# Patient Record
Sex: Male | Born: 1965 | Race: Black or African American | Hispanic: No | Marital: Single | Smoking: Current every day smoker
Health system: Southern US, Community
[De-identification: ages and names within clinical notes are randomized; demographics above are authoritative.]

## PROBLEM LIST (undated history)

## (undated) DIAGNOSIS — J45909 Unspecified asthma, uncomplicated: Secondary | ICD-10-CM

---

## 2017-09-09 ENCOUNTER — Encounter: Payer: Self-pay | Admitting: Emergency Medicine

## 2017-09-09 DIAGNOSIS — R109 Unspecified abdominal pain: Secondary | ICD-10-CM | POA: Diagnosis not present

## 2017-09-09 DIAGNOSIS — J45909 Unspecified asthma, uncomplicated: Secondary | ICD-10-CM | POA: Diagnosis not present

## 2017-09-09 DIAGNOSIS — R112 Nausea with vomiting, unspecified: Secondary | ICD-10-CM | POA: Insufficient documentation

## 2017-09-09 DIAGNOSIS — R0602 Shortness of breath: Secondary | ICD-10-CM | POA: Diagnosis not present

## 2017-09-09 DIAGNOSIS — F17200 Nicotine dependence, unspecified, uncomplicated: Secondary | ICD-10-CM | POA: Diagnosis not present

## 2017-09-09 DIAGNOSIS — K8050 Calculus of bile duct without cholangitis or cholecystitis without obstruction: Secondary | ICD-10-CM | POA: Insufficient documentation

## 2017-09-09 LAB — CBC
HEMATOCRIT: 48.7 % (ref 40.0–52.0)
HEMOGLOBIN: 16.2 g/dL (ref 13.0–18.0)
MCH: 29.2 pg (ref 26.0–34.0)
MCHC: 33.2 g/dL (ref 32.0–36.0)
MCV: 88 fL (ref 80.0–100.0)
Platelets: 220 10*3/uL (ref 150–440)
RBC: 5.53 MIL/uL (ref 4.40–5.90)
RDW: 13.2 % (ref 11.5–14.5)
WBC: 6.3 10*3/uL (ref 3.8–10.6)

## 2017-09-09 LAB — COMPREHENSIVE METABOLIC PANEL WITH GFR
ALT: 17 U/L (ref 17–63)
AST: 21 U/L (ref 15–41)
Albumin: 4.2 g/dL (ref 3.5–5.0)
Alkaline Phosphatase: 72 U/L (ref 38–126)
Anion gap: 10 (ref 5–15)
BUN: 9 mg/dL (ref 6–20)
CO2: 26 mmol/L (ref 22–32)
Calcium: 9.1 mg/dL (ref 8.9–10.3)
Chloride: 100 mmol/L — ABNORMAL LOW (ref 101–111)
Creatinine, Ser: 1.1 mg/dL (ref 0.61–1.24)
GFR calc Af Amer: >60 mL/min
GFR calc non Af Amer: >60 mL/min
Glucose, Bld: 113 mg/dL — ABNORMAL HIGH (ref 65–99)
Potassium: 4 mmol/L (ref 3.5–5.1)
Sodium: 136 mmol/L (ref 135–145)
Total Bilirubin: 1.6 mg/dL — ABNORMAL HIGH (ref 0.3–1.2)
Total Protein: 7.6 g/dL (ref 6.5–8.1)

## 2017-09-09 LAB — TROPONIN I: Troponin I: <0.03 ng/mL

## 2017-09-09 LAB — LIPASE, BLOOD: LIPASE: 35 U/L (ref 11–51)

## 2017-09-09 NOTE — ED Triage Notes (Signed)
Pt ambulatory to triage with steady gait, no distress noted. Pt c/o upper center abdominal pain x2 days. Pt denies N/V/D.

## 2017-09-10 ENCOUNTER — Emergency Department: Payer: BLUE CROSS/BLUE SHIELD

## 2017-09-10 ENCOUNTER — Emergency Department
Admission: EM | Admit: 2017-09-10 | Discharge: 2017-09-10 | Disposition: A | Discharge status: Home / Self Care | Payer: BLUE CROSS/BLUE SHIELD | Attending: Emergency Medicine | Admitting: Emergency Medicine

## 2017-09-10 DIAGNOSIS — R0602 Shortness of breath: Secondary | ICD-10-CM

## 2017-09-10 DIAGNOSIS — R112 Nausea with vomiting, unspecified: Secondary | ICD-10-CM

## 2017-09-10 DIAGNOSIS — R109 Unspecified abdominal pain: Secondary | ICD-10-CM

## 2017-09-10 DIAGNOSIS — K8050 Calculus of bile duct without cholangitis or cholecystitis without obstruction: Secondary | ICD-10-CM

## 2017-09-10 HISTORY — DX: Unspecified asthma, uncomplicated: J45.909

## 2017-09-10 LAB — URINALYSIS, COMPLETE (UACMP) WITH MICROSCOPIC
BILIRUBIN URINE: NEGATIVE
Bacteria, UA: NONE SEEN
GLUCOSE, UA: NEGATIVE mg/dL
HGB URINE DIPSTICK: NEGATIVE
Ketones, ur: 5 mg/dL — AB
Leukocytes, UA: NEGATIVE
NITRITE: NEGATIVE
PROTEIN: NEGATIVE mg/dL
Specific Gravity, Urine: 1.011 (ref 1.005–1.030)
pH: 7 (ref 5.0–8.0)

## 2017-09-10 MED ORDER — PREDNISONE 20 MG PO TABS
60.0000 mg | ORAL_TABLET | Freq: Once | ORAL | Status: AC
  Administered 2017-09-10: 60 mg via ORAL
  Filled 2017-09-10: qty 3

## 2017-09-10 MED ORDER — FAMOTIDINE 40 MG PO TABS: 40.0000 mg | ORAL_TABLET | Freq: Every day | ORAL | 0 refills | Status: AC

## 2017-09-10 MED ORDER — METOCLOPRAMIDE HCL 10 MG PO TABS: 10.0000 mg | ORAL_TABLET | Freq: Three times a day (TID) | ORAL | 0 refills | Status: DC | PRN

## 2017-09-10 MED ORDER — IPRATROPIUM-ALBUTEROL 0.5-2.5 (3) MG/3ML IN SOLN
3.0000 mL | Freq: Once | RESPIRATORY_TRACT | Status: AC
  Administered 2017-09-10: 3 mL via RESPIRATORY_TRACT
  Filled 2017-09-10: qty 3

## 2017-09-10 MED ORDER — METOCLOPRAMIDE HCL 10 MG PO TABS
10.0000 mg | ORAL_TABLET | Freq: Once | ORAL | Status: AC
  Administered 2017-09-10: 10 mg via ORAL
  Filled 2017-09-10: qty 1

## 2017-09-10 MED ORDER — GI COCKTAIL ~~LOC~~
30.0000 mL | Freq: Once | ORAL | Status: AC
  Administered 2017-09-10: 30 mL via ORAL
  Filled 2017-09-10: qty 30
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
## 2017-09-10 NOTE — Discharge Instructions (Signed)
These follow-up with the acute care clinic as well as with surgery for further evaluation of your gallbladder sludge and pain.

## 2017-09-10 NOTE — ED Provider Notes (Signed)
[facility] Emergency Department Provider Note   ____________________________________________   First MD Initiated Contact with Patient 09/10/17 [phone]     (approximate)  I have reviewed the triage vital signs and the nursing notes.   HISTORY  Chief Complaint Abdominal Pain    HPI Ahsan Esterline is a 52 y.o. male who comes into the hospital today stating that he has a problem with his stomach and his breathing.  He reports that he has had some stomach pain for the past couple of days and he vomited a couple of times.  It was yellow looking and he states that his pain is currently a 2 out of 10.  He denies any diarrhea.  He is also had shortness of breath for what he reports is a good little while.  He states it is been a few years.  He reports that sometimes when he walks or is talking he gets out of breath.  The patient is a smoker.  He reports that is not any worse than it has been but also his neck in any better.  The patient denies any chest pain.  He is at some cough with white sputum.  The patient does not go to the physician's office.   Past Medical History:  Diagnosis Date  . Asthma     There are no active problems to display for this patient.   History reviewed. No pertinent surgical history.  Prior to Admission medications   Not on File    Allergies Patient has no known allergies.  History reviewed. No pertinent family history.  Social History Social History   Tobacco Use  . Smoking status: Current Every Day Smoker  . Smokeless tobacco: Never Used  Substance Use Topics  . Alcohol use: Not on file  . Drug use: Not on file    Review of Systems  Constitutional: No fever/chills Eyes: No visual changes. ENT: No sore throat. Cardiovascular: Denies chest pain. Respiratory:  shortness of breath. Gastrointestinal:  abdominal pain, nausea, vomiting.  No diarrhea.  No constipation. Genitourinary: Negative for  dysuria. Musculoskeletal: Negative for back pain. Skin: Negative for rash. Neurological: Negative for headaches, focal weakness or numbness.   ____________________________________________   PHYSICAL EXAM:  VITAL SIGNS: ED Triage Vitals [09/09/17 2309]  Enc Vitals Group     BP (!) 149/82     Pulse Rate 62     Resp 16     Temp 98.2 F (36.8 C)     Temp Source Oral     SpO2 100 %     Weight 130 lb (59 kg)     Height 5\' 4"  (1.626 m)     Head Circumference      Peak Flow      Pain Score      Pain Loc      Pain Edu?      Excl. in [place]?     Constitutional: Alert and oriented. Well appearing and in moderate distress. Eyes: Conjunctivae are normal. PERRL. EOMI. Head: Atraumatic. Nose: No congestion/rhinnorhea. Mouth/Throat: Mucous membranes are moist.  Oropharynx non-erythematous. Cardiovascular: Normal rate, regular rhythm. Grossly normal heart sounds.  Good peripheral circulation. Respiratory: Normal respiratory effort.  No retractions. Lungs CTAB. Gastrointestinal: Soft with some epigastric and RUQ pain to palpation. No distention. Positive bowel sounds Musculoskeletal: No lower extremity tenderness nor edema.   Neurologic:  Normal speech and language.  Skin:  Skin is warm, dry and intact.  Psychiatric: Mood and affect are normal.  ____________________________________________   LABS (all labs ordered are listed, but only abnormal results are displayed)  Labs Reviewed  COMPREHENSIVE METABOLIC PANEL - Abnormal; Notable for the following components:      Result Value   Chloride 100 (*)    Glucose, Bld 113 (*)    Total Bilirubin 1.6 (*)    All other components within normal limits  URINALYSIS, COMPLETE (UACMP) WITH MICROSCOPIC - Abnormal; Notable for the following components:   Color, Urine YELLOW (*)    APPearance CLEAR (*)    Ketones, ur 5 (*)    Squamous Epithelial / LPF 0-5 (*)    All other components within normal limits  LIPASE, BLOOD  CBC  TROPONIN I    ____________________________________________  EKG  ED ECG REPORT I, Loney Hering, the attending physician, personally viewed and interpreted this ECG.   Date: 09/09/2017  EKG Time: 2311  Rate: 57  Rhythm: sinus bradycardia  Axis: normal  Intervals:none  ST&T Change: none  ____________________________________________  RADIOLOGY  Dg Chest 2 View  Result Date: 09/10/2017 CLINICAL DATA:  Shortness of breath. EXAM: CHEST  2 VIEW COMPARISON:  None. FINDINGS: The lungs are hyperinflated. The cardiomediastinal contours are normal. Pulmonary vasculature is normal. No consolidation, pleural effusion, or pneumothorax. No acute osseous abnormalities are seen. 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 No acute abnormality. Electronically Signed   By: Jeb Levering M.D.   On: 09/10/2017 03:23   US Abdomen Limited Ruq  Result Date: 09/10/2017 CLINICAL DATA:  Abdominal pain for 2 months. EXAM: ULTRASOUND ABDOMEN LIMITED RIGHT UPPER QUADRANT COMPARISON:  None. FINDINGS: Gallbladder: Physiologically distended with mild intraluminal sludge. No gallstones or wall thickening visualized. No sonographic Murphy sign noted by sonographer. Common bile duct: Diameter: 4 mm, normal. Liver: No focal lesion identified. Within normal limits in parenchymal echogenicity. Portal vein is patent on color Doppler imaging with normal direction of blood flow towards the liver. IMPRESSION: 1. Gallbladder sludge without gallstones or gallbladder inflammation. 2. No biliary dilatation. 3. Normal sonographic appearance of the liver. Electronically Signed   By: Jeb Levering M.D.   On: 09/10/2017 03:45    ____________________________________________   PROCEDURES  Procedure(s) performed: None  Procedures  Critical Care performed: No  ____________________________________________   INITIAL IMPRESSION / ASSESSMENT AND PLAN / ED COURSE  As part of my medical decision  making, I reviewed the following data within the electronic MEDICAL RECORD NUMBER Notes from prior ED visits and [place] Controlled Substance Database   This is a 52 year old male who comes into the hospital today with some upper abdominal pain as well as some shortness of breath.  The pain is acute and started a couple of days ago but the patient's shortness of breath has been chronic.  My differential diagnosis regarding the patient's abdominal pain is gastritis, pancreatitis, biliary colic.  My differential diagnosis regarding the patient's shortness of breath includes pneumonia, acute coronary syndrome, COPD.  The patient had some blood work drawn and it was unremarkable.  The patient did have a mildly elevated bilirubin of 1.6.  The patient's urinalysis was unremarkable his lipase was negative and his troponin was negative.  Since the patient has been having this pain going on for some time I did not feel it appropriate to repeat the troponin.  The patient had a chest x-ray that showed some hyperinflation and an ultrasound that showed some gallbladder sludge without stones or inflammation.  I feel that the patient's pain is due to biliary  colic.  I initially gave the patient a GI cocktail but he vomited after that.  He states that he felt much better.  I then gave him some Reglan and his pain improved.  I will discharge the patient to home to follow-up with surgery for removal of his gallbladder.  I will give him some medicine for gastritis as well as some nausea medicine.  He should return with any other concerns or conditions.  Clinical Course as of Sep 10 506  Tue Sep 10, 2017  0229 Hyperinflation, no acute abnormality DG Chest 2 View [AW]    Clinical Course User Index [AW] Loney Hering, MD     ____________________________________________   FINAL CLINICAL IMPRESSION(S) / ED DIAGNOSES  Final diagnoses:  Abdominal pain  Non-intractable vomiting with nausea, unspecified vomiting type   Biliary colic  Shortness of breath     ED Discharge Orders    None       Note:  This document was prepared using Dragon voice recognition software and may include unintentional dictation errors.    Loney Hering, MD 09/10/17 [phone]

## 2017-09-12 ENCOUNTER — Telehealth: Payer: Self-pay | Admitting: Surgery

## 2017-09-12 NOTE — Telephone Encounter (Signed)
Patient has called and made an appointment with Dr Dahlia Byes.

## 2017-09-12 NOTE — Telephone Encounter (Signed)
I have called patient to make an appointment with any surgeon for biliary colic-gallbladder. Patient was seen in the ED at [facility] for this. No answer. I have left a message for patient to call the office to make an appointment.

## 2017-09-17 ENCOUNTER — Other Ambulatory Visit
Admission: RE | Admit: 2017-09-17 | Discharge: 2017-09-17 | Disposition: A | Discharge status: Home / Self Care | Payer: BLUE CROSS/BLUE SHIELD | Source: Ambulatory Visit | Attending: Surgery | Admitting: Surgery

## 2017-09-17 ENCOUNTER — Telehealth: Payer: Self-pay

## 2017-09-17 ENCOUNTER — Encounter: Payer: Self-pay | Admitting: Surgery

## 2017-09-17 ENCOUNTER — Ambulatory Visit: Payer: BLUE CROSS/BLUE SHIELD | Admitting: Surgery

## 2017-09-17 DIAGNOSIS — R634 Abnormal weight loss: Secondary | ICD-10-CM

## 2017-09-17 LAB — CBC WITH DIFFERENTIAL/PLATELET
Basophils Absolute: 0.1 10*3/uL (ref 0–0.1)
Basophils Relative: 1 %
EOS ABS: 0.2 10*3/uL (ref 0–0.7)
Eosinophils Relative: 3 %
HCT: 47.2 % (ref 40.0–52.0)
HEMOGLOBIN: 16 g/dL (ref 13.0–18.0)
LYMPHS ABS: 3.8 10*3/uL — AB (ref 1.0–3.6)
Lymphocytes Relative: 43 %
MCH: 29.8 pg (ref 26.0–34.0)
MCHC: 33.8 g/dL (ref 32.0–36.0)
MCV: 88 fL (ref 80.0–100.0)
MONO ABS: 0.8 10*3/uL (ref 0.2–1.0)
MONOS PCT: 9 %
NEUTROS PCT: 44 %
Neutro Abs: 3.9 10*3/uL (ref 1.4–6.5)
Platelets: 265 10*3/uL (ref 150–440)
RBC: 5.36 MIL/uL (ref 4.40–5.90)
RDW: 13.4 % (ref 11.5–14.5)
WBC: 8.8 10*3/uL (ref 3.8–10.6)

## 2017-09-17 LAB — BILIRUBIN, FRACTIONATED(TOT/DIR/INDIR)
BILIRUBIN TOTAL: 0.5 mg/dL (ref 0.3–1.2)
Bilirubin, Direct: <0.1 mg/dL — ABNORMAL LOW (ref 0.1–0.5)

## 2017-09-17 NOTE — Patient Instructions (Signed)
Please go downstairs and have your blood drawn today. We will call you with your results.  We have scheduled you for a CT Scan of your Abdomen and Pelvis. This has been scheduled at [facility] location. Please Check-in at 12:45 PM, 15 minutes prior to your scheduled appointment. If you need to reschedule your Scan, you may do so by calling [phone].  You will need to pick up a prep kit at least 24 hours in advance of your Scan: You may pick this up at the [facility] department at [place] Location, or [employer].  Bring a list of medications with you to your appointment and you may have nothing to eat or drink 4 hours prior to your CT Scan.   We will refer you to gastroenterology so they could scope you. They will contact you with the appointment date and time. Please call us in case they have not contacted you in a week.

## 2017-09-17 NOTE — Progress Notes (Signed)
Patient ID: Willie Dalton, male   DOB: [birth year], 52 y.o.   MRN: [id number]  HPI Willie Dalton is a 52 y.o. male seen after being evaluated in the ER by Dr. Dahlia Client for abdominal pain. Reports that he has been having the pain for close to 2 years. He is in the epigastric area apparently gets worse after eating. He does have some burning sensation in his epigastric area. Denies any NSAIDs intake. He also reports about 25 pounds of weight loss over the last year or so.He smokes a pack and half a day. Does have a family history of colon cancer. He reports that he has had nausea and vomited a few times as well as constipation and diarrhea. He does have some dyspnea on exertion. Ultrasound performed a week ago , of personally reviewed it, evidence was large. Normal common bile duct no stones are present. His bilirubin was slightly elevated to 1.5. White count is normal as well as creatinine  HPI  Past Medical History:  Diagnosis Date  . Asthma     No past surgical history on file.  Family History  Problem Relation Age of Onset  . Colon cancer Mother   . Asthma Mother   . Healthy Father   . Colon cancer Maternal Uncle   . Colon cancer Maternal Uncle     Social History Social History   Tobacco Use  . Smoking status: Current Every Day Smoker  . Smokeless tobacco: Never Used  Substance Use Topics  . Alcohol use: Yes  . Drug use: No    Allergies  Allergen Reactions  . Bee Venom Anaphylaxis    Current Outpatient Medications  Medication Sig Dispense Refill  . famotidine (PEPCID) 40 MG tablet Take 1 tablet (40 mg total) by mouth daily for 14 days. 14 tablet 0   No current facility-administered medications for this visit.      Review of Systems Full ROS  was asked and was negative except for the information on the HPI  Physical Exam Blood pressure (!) 142/90, pulse 66, temperature 98.3 F (36.8 C), temperature source Oral, height 5\' 4"  (1.626 m), weight 49.9 kg (110  lb). CONSTITUTIONAL: Very thin pt in NAD  EYES: Pupils are equal, round, and reactive to light, Sclera are non-icteric. EARS, NOSE, MOUTH AND THROAT: The oropharynx is clear. The oral mucosa is pink and moist. Hearing is intact to voice. LYMPH NODES:  Lymph nodes in the neck are normal. RESPIRATORY:  Lungs are clear. There is normal respiratory effort, with equal breath sounds bilaterally, and without pathologic use of accessory muscles. CARDIOVASCULAR: Heart is regular without murmurs, gallops, or rubs. GI: The abdomen is  soft, Mild tenderness to palpation on epigastric area. No murphy or peritonitis. There is no hepatosplenomegaly. There are normal bowel sounds GU: Rectal deferred.   MUSCULOSKELETAL: Normal muscle strength and tone. No cyanosis or edema.   SKIN: Turgor is good and there are no pathologic skin lesions or ulcers. NEUROLOGIC: Motor and sensation is grossly normal. Cranial nerves are grossly intact. PSYCH:  Oriented to person, place and time. Affect is normal.  Data Reviewed  I have personally reviewed the patient's imaging, laboratory findings and medical records.    Assessment/Plan 52 year old male with epigastric pain and weight loss. Clinical history is not consistent with biliary colic. She did have some sludge on ultrasound. Because of the weight loss family history of colon cancer and my differential will be a GI malignancy versus peptic ulcer disease. I will start with  a CT scan of the abdomen and pelvis as well as a GI consultation for upper and lower endoscopy. He has never had a colonoscopy in the past and he does have symptoms related to peptic ulcer disease. And I will perform this workup before entertaining any surgical interventions.  scars with the patient in detail about my thought process and he understands. No need for any emergent surgical intervention at this time I'll repeat LFTs and  Fractionated bilirubin he had a slight increase in bilirubin a week  ago  Caroleen Hamman, MD FACS General Surgeon 09/17/2017, 2:01 PM

## 2017-09-17 NOTE — Telephone Encounter (Signed)
Called patient to let him know that his lab results were normal. I also told him that once he had his CT Scan on 09/30/2017 that I would call him with results.

## 2017-09-20 ENCOUNTER — Ambulatory Visit
Admission: RE | Admit: 2017-09-20 | Discharge: 2017-09-20 | Disposition: A | Discharge status: Home / Self Care | Payer: BLUE CROSS/BLUE SHIELD | Source: Ambulatory Visit | Attending: Surgery | Admitting: Surgery

## 2017-09-20 DIAGNOSIS — Z681 Body mass index (BMI) 19 or less, adult: Secondary | ICD-10-CM | POA: Diagnosis not present

## 2017-09-20 DIAGNOSIS — K5730 Diverticulosis of large intestine without perforation or abscess without bleeding: Secondary | ICD-10-CM | POA: Diagnosis not present

## 2017-09-20 DIAGNOSIS — R634 Abnormal weight loss: Secondary | ICD-10-CM | POA: Diagnosis not present

## 2017-09-20 MED ORDER — IOPAMIDOL (ISOVUE-300) INJECTION 61%
75.0000 mL | Freq: Once | INTRAVENOUS | Status: AC | PRN
  Administered 2017-09-20: 75 mL via INTRAVENOUS

## 2017-09-23 NOTE — Telephone Encounter (Signed)
-----   Message from Jules Husbands, MD sent at 09/20/2017  7:05 PM EST ----- Please let him know that his CT was nml. He needs to keep up w his appts ----- Message ----- From: Interface, Rad Results In Sent: 09/20/2017   3:36 PM To: Jules Husbands, MD

## 2017-09-23 NOTE — Telephone Encounter (Signed)
Called patient per Dr. Dahlia Byes to let him know that his CT Scan was normal and to keep his upcoming appointments.

## 2017-09-25 ENCOUNTER — Other Ambulatory Visit
Admission: RE | Admit: 2017-09-25 | Discharge: 2017-09-25 | Disposition: A | Discharge status: Home / Self Care | Payer: BLUE CROSS/BLUE SHIELD | Source: Ambulatory Visit | Attending: Gastroenterology | Admitting: Gastroenterology

## 2017-09-25 ENCOUNTER — Ambulatory Visit: Payer: BLUE CROSS/BLUE SHIELD | Admitting: Gastroenterology

## 2017-09-25 ENCOUNTER — Encounter: Payer: Self-pay | Admitting: Gastroenterology

## 2017-09-25 ENCOUNTER — Ambulatory Visit (INDEPENDENT_AMBULATORY_CARE_PROVIDER_SITE_OTHER): Payer: BLUE CROSS/BLUE SHIELD | Admitting: Gastroenterology

## 2017-09-25 VITALS — BP 133/80 | HR 71 | Temp 97.9°F | Ht 64.0 in | Wt 113.8 lb

## 2017-09-25 DIAGNOSIS — F17200 Nicotine dependence, unspecified, uncomplicated: Secondary | ICD-10-CM

## 2017-09-25 DIAGNOSIS — G8929 Other chronic pain: Secondary | ICD-10-CM | POA: Insufficient documentation

## 2017-09-25 DIAGNOSIS — F1290 Cannabis use, unspecified, uncomplicated: Secondary | ICD-10-CM

## 2017-09-25 DIAGNOSIS — R1013 Epigastric pain: Secondary | ICD-10-CM

## 2017-09-25 DIAGNOSIS — R683 Clubbing of fingers: Secondary | ICD-10-CM | POA: Diagnosis not present

## 2017-09-25 DIAGNOSIS — R634 Abnormal weight loss: Secondary | ICD-10-CM

## 2017-09-25 LAB — TSH: TSH: 1.289 u[IU]/mL (ref 0.350–4.500)

## 2017-09-25 MED ORDER — PEG 3350-KCL-NA BICARB-NACL 420 G PO SOLR
4000.0000 mL | Freq: Once | ORAL | 0 refills | Status: AC
Start: 2017-09-25 — End: 2017-09-25

## 2017-09-25 MED ORDER — OMEPRAZOLE 40 MG PO CPDR: 40.0000 mg | DELAYED_RELEASE_CAPSULE | Freq: Every day | ORAL | 3 refills | Status: DC

## 2017-09-25 NOTE — Progress Notes (Signed)
Jonathon Bellows MD, MRCP(U.K) [address]  [place]  [place], [place] [address]  Main: [phone]  Fax: [phone]   Gastroenterology Consultation  Referring Provider:     Jules Husbands, MD Primary Care Physician:  Patient, No Pcp Per Primary Gastroenterologist:  Dr. Jonathon Bellows  Reason for Consultation:     Abdominal pain         HPI:   Willie Dalton is a 52 y.o. y/o male referred for evaluation of abdominal pain.   He has been referred for epigastric pain. He was recently seen by Dr Dahlia Byes in Surgery for abnormal weight loss and epigastric pain. He wanted to rule out a GI malignancy or peptic ulcer disease , was referred to [place].  Ct scan of the abdomen 09/20/17: no acute abnormalities 09/10/17: RUQ USG: Gall bladder sludge , no biliary dilation.   Recent labs 09/2017 : normal CBC, Elevated T bilirubin , when repeated was normal   Abdominal pain: Onset: ongoing for a few months , not better or not getting much worse , As long he takes his pepcid feels better , has the pain only when he does not take the meds  Site :points to his epigastrium  Radiation: localized  Severity :10/10 -bent ovr  Nature of pain: punching in nature  Aggravating factors: worse when he walks, not affected with eating  Relieving factors :mediccations  Weight loss: 20 lbs over a few weeks  NSAID use: no  PPI use :Pepcid  Gall bladder surgery: no  Frequency of bowel movements: daily , soft  Change in bowel movements: no  Relief with bowel movements: a bit  Gas/Bloating/Abdominal distension: yes , no association with pain   Mom and uncle had colon cancer, never had a colonoscopy, he has been a smoker for the past 25 years. Uses marijuana. Feels better when he takes a hot shower.     Past Medical History:  Diagnosis Date  . Asthma     No past surgical history on file.  Prior to Admission medications   Medication Sig Start Date End Date Taking? Authorizing Provider  famotidine (PEPCID)  40 MG tablet Take 1 tablet (40 mg total) by mouth daily for 14 days. 09/10/17 09/24/17  Loney Hering, MD    Family History  Problem Relation Age of Onset  . Colon cancer Mother   . Asthma Mother   . Healthy Father   . Colon cancer Maternal Uncle   . Colon cancer Maternal Uncle      Social History   Tobacco Use  . Smoking status: Current Every Day Smoker  . Smokeless tobacco: Never Used  Substance Use Topics  . Alcohol use: Yes  . Drug use: No    Allergies as of 09/25/2017 - Review Complete 09/20/2017  Allergen Reaction Noted  . Bee venom Anaphylaxis 09/17/2017    Review of Systems:    All systems reviewed and negative except where noted in HPI.   Physical Exam:  There were no vitals taken for this visit. No LMP for male patient. Psych:  Alert and cooperative. Normal mood and affect. General:   Alert,  Well-developed, well-nourished, pleasant and cooperative in NAD Head:  Normocephalic and atraumatic. Eyes:  Sclera clear, no icterus.   Conjunctiva pink. Ears:  Normal auditory acuity. Nose:  No deformity, discharge, or lesions. Mouth:  No deformity or lesions,oropharynx pink & moist. Neck:  Supple; no masses or thyromegaly. Lungs:  Respirations even and unlabored.  Clear throughout to auscultation.  No wheezes, crackles, or rhonchi. No acute distress. Heart:  Regular rate and rhythm; no murmurs, clicks, rubs, or gallops. Abdomen:  Normal bowel sounds.  No bruits.  Soft, non-tender and non-distended without masses, hepatosplenomegaly or hernias noted.  No guarding or rebound tenderness.    Neurologic:  Alert and oriented x3;  grossly normal neurologically. Skin:  Intact without significant lesions or rashes. No jaundice. Lymph Nodes:  No significant cervical adenopathy. Psych:  Alert and cooperative. Normal mood and affect.  Imaging Studies: Dg Chest 2 View  Result Date: 09/10/2017 CLINICAL DATA:  Shortness of breath. EXAM: CHEST  2 VIEW COMPARISON:  None.  FINDINGS: The lungs are hyperinflated. The cardiomediastinal contours are normal. Pulmonary vasculature is normal. No consolidation, pleural effusion, or pneumothorax. No acute osseous abnormalities are seen. 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 No acute abnormality. Electronically Signed   By: Jeb Levering M.D.   On: 09/10/2017 03:23   Ct Abdomen Pelvis W Contrast  Result Date: 09/20/2017 CLINICAL DATA:  Nausea vomiting diarrhea 4 months with 20 pound weight loss EXAM: CT ABDOMEN AND PELVIS WITH CONTRAST TECHNIQUE: Multidetector CT imaging of the abdomen and pelvis was performed using the standard protocol following bolus administration of intravenous contrast. CONTRAST:  12mL ISOVUE-300 IOPAMIDOL (ISOVUE-300) INJECTION 61% COMPARISON:  Ultrasound 09/10/2017 FINDINGS: Lower chest: negative Hepatobiliary: Normal liver.  Gallbladder and bile ducts normal. Pancreas: Negative Spleen: Negative Adrenals/Urinary Tract: Normal kidneys. No renal mass or obstruction or calculus. Normal urinary bladder Stomach/Bowel: Negative for bowel obstruction. Negative for bowel mass or edema. Normal appendix. Mild diverticular change in the sigmoid colon without acute edema. Vascular/Lymphatic: Atherosclerotic aorta, mild.  No aneurysm. Reproductive: Mild prostate enlargement Other: Negative for free fluid Musculoskeletal: Mild chronic compression fracture superior endplate of L2. No acute skeletal lesion. IMPRESSION: No acute abnormality. No cause for weight loss identified. Mild sigmoid diverticulosis. Electronically Signed   By: Franchot Gallo M.D.   On: 09/20/2017 15:33   US Abdomen Limited Ruq  Result Date: 09/10/2017 CLINICAL DATA:  Abdominal pain for 2 months. EXAM: ULTRASOUND ABDOMEN LIMITED RIGHT UPPER QUADRANT COMPARISON:  None. FINDINGS: Gallbladder: Physiologically distended with mild intraluminal sludge. No gallstones or wall thickening visualized. No sonographic Murphy  sign noted by sonographer. Common bile duct: Diameter: 4 mm, normal. Liver: No focal lesion identified. Within normal limits in parenchymal echogenicity. Portal vein is patent on color Doppler imaging with normal direction of blood flow towards the liver. IMPRESSION: 1. Gallbladder sludge without gallstones or gallbladder inflammation. 2. No biliary dilatation. 3. Normal sonographic appearance of the liver. Electronically Signed   By: Jeb Levering M.D.   On: 09/10/2017 03:45    Assessment and Plan:   Mclane Arora is a 52 y.o. y/o male has been referred for evaluation of abdominal pain , unintentional weight loss. Differiatls include malignancy , peptic ulcer disease, effects of marijuana use (feels much better with hot showers), he does have clubbing of his fingers and is a long time smoker- will need to rule out lung cancer.   Plan  1. Stool H pylori antigen  2. Trial of PPI 3.  CT scan of the chest as he has clubbing of his nails, weight loss, smoker history - rule out lung mass 4. Stop marijuana use 5. EGD +colonoscopy to evaluate abdominal pain and weight loss 6. Stop smoking  7. Samples of Dexilant provided   I have discussed alternative options, risks & benefits,  which include, but are not limited to, bleeding, infection,  perforation,respiratory complication & drug reaction.  The patient agrees with this plan & written consent will be obtained.     Follow up in 6 weeks  Dr Jonathon Bellows MD,MRCP(U.K)

## 2017-09-26 LAB — GLIADIN ANTIBODIES, SERUM
ANTIGLIADIN ABS, IGA: 6 U (ref 0–19)
Gliadin IgG: 3 units (ref 0–19)

## 2017-09-26 LAB — TISSUE TRANSGLUTAMINASE, IGA: Tissue Transglutaminase Ab, IgA: <2 U/mL (ref 0–3)

## 2017-09-27 ENCOUNTER — Other Ambulatory Visit
Admission: RE | Admit: 2017-09-27 | Discharge: 2017-09-27 | Disposition: A | Discharge status: Home / Self Care | Payer: BLUE CROSS/BLUE SHIELD | Source: Ambulatory Visit | Attending: Gastroenterology | Admitting: Gastroenterology

## 2017-09-27 DIAGNOSIS — G8929 Other chronic pain: Secondary | ICD-10-CM | POA: Diagnosis not present

## 2017-09-27 DIAGNOSIS — R1013 Epigastric pain: Secondary | ICD-10-CM | POA: Diagnosis present

## 2017-09-27 LAB — RETICULIN ANTIBODIES, IGA W TITER: Reticulin Ab, IgA: NEGATIVE titer (ref <2.5)

## 2017-10-02 ENCOUNTER — Ambulatory Visit
Admission: RE | Admit: 2017-10-02 | Discharge: 2017-10-02 | Disposition: A | Discharge status: Home / Self Care | Payer: BLUE CROSS/BLUE SHIELD | Source: Ambulatory Visit | Attending: Gastroenterology | Admitting: Gastroenterology

## 2017-10-02 ENCOUNTER — Ambulatory Visit: Payer: BLUE CROSS/BLUE SHIELD | Admitting: Anesthesiology

## 2017-10-02 ENCOUNTER — Encounter: Payer: Self-pay | Admitting: *Deleted

## 2017-10-02 ENCOUNTER — Encounter
Admission: RE | Disposition: A | Discharge status: Home / Self Care | Payer: Self-pay | Source: Ambulatory Visit | Attending: Gastroenterology

## 2017-10-02 DIAGNOSIS — Z681 Body mass index (BMI) 19 or less, adult: Secondary | ICD-10-CM | POA: Insufficient documentation

## 2017-10-02 DIAGNOSIS — Z825 Family history of asthma and other chronic lower respiratory diseases: Secondary | ICD-10-CM | POA: Diagnosis not present

## 2017-10-02 DIAGNOSIS — F17200 Nicotine dependence, unspecified, uncomplicated: Secondary | ICD-10-CM | POA: Diagnosis not present

## 2017-10-02 DIAGNOSIS — R634 Abnormal weight loss: Secondary | ICD-10-CM | POA: Diagnosis not present

## 2017-10-02 DIAGNOSIS — R1013 Epigastric pain: Secondary | ICD-10-CM | POA: Diagnosis not present

## 2017-10-02 DIAGNOSIS — K5730 Diverticulosis of large intestine without perforation or abscess without bleeding: Secondary | ICD-10-CM | POA: Insufficient documentation

## 2017-10-02 DIAGNOSIS — Z800 Family history of malignant neoplasm of digestive organs: Secondary | ICD-10-CM | POA: Insufficient documentation

## 2017-10-02 DIAGNOSIS — G8929 Other chronic pain: Secondary | ICD-10-CM | POA: Diagnosis not present

## 2017-10-02 DIAGNOSIS — K269 Duodenal ulcer, unspecified as acute or chronic, without hemorrhage or perforation: Secondary | ICD-10-CM | POA: Diagnosis not present

## 2017-10-02 DIAGNOSIS — B9681 Helicobacter pylori [H. pylori] as the cause of diseases classified elsewhere: Secondary | ICD-10-CM | POA: Diagnosis not present

## 2017-10-02 DIAGNOSIS — Z91030 Bee allergy status: Secondary | ICD-10-CM | POA: Insufficient documentation

## 2017-10-02 DIAGNOSIS — R109 Unspecified abdominal pain: Secondary | ICD-10-CM | POA: Diagnosis present

## 2017-10-02 DIAGNOSIS — F1910 Other psychoactive substance abuse, uncomplicated: Secondary | ICD-10-CM | POA: Diagnosis not present

## 2017-10-02 DIAGNOSIS — Z79899 Other long term (current) drug therapy: Secondary | ICD-10-CM | POA: Insufficient documentation

## 2017-10-02 DIAGNOSIS — D124 Benign neoplasm of descending colon: Secondary | ICD-10-CM

## 2017-10-02 DIAGNOSIS — J45909 Unspecified asthma, uncomplicated: Secondary | ICD-10-CM | POA: Diagnosis not present

## 2017-10-02 DIAGNOSIS — K2970 Gastritis, unspecified, without bleeding: Secondary | ICD-10-CM | POA: Diagnosis not present

## 2017-10-02 DIAGNOSIS — D125 Benign neoplasm of sigmoid colon: Secondary | ICD-10-CM | POA: Insufficient documentation

## 2017-10-02 HISTORY — PX: COLONOSCOPY WITH PROPOFOL: SHX5780

## 2017-10-02 HISTORY — PX: ESOPHAGOGASTRODUODENOSCOPY (EGD) WITH PROPOFOL: SHX5813

## 2017-10-02 SURGERY — COLONOSCOPY WITH PROPOFOL
Anesthesia: General

## 2017-10-02 MED ORDER — PROPOFOL 500 MG/50ML IV EMUL
INTRAVENOUS | Status: AC
  Filled 2017-10-02: qty 50

## 2017-10-02 MED ORDER — PROPOFOL 10 MG/ML IV BOLUS
INTRAVENOUS | Status: DC | PRN
  Administered 2017-10-02: 20 mg via INTRAVENOUS
  Administered 2017-10-02: 30 mg via INTRAVENOUS
  Administered 2017-10-02: 20 mg via INTRAVENOUS
  Administered 2017-10-02: 50 mg via INTRAVENOUS
  Administered 2017-10-02: 100 mg via INTRAVENOUS

## 2017-10-02 MED ORDER — IPRATROPIUM-ALBUTEROL 0.5-2.5 (3) MG/3ML IN SOLN
RESPIRATORY_TRACT | Status: AC
  Filled 2017-10-02: qty 3

## 2017-10-02 MED ORDER — METOPROLOL TARTRATE 5 MG/5ML IV SOLN
5.0000 mg | Freq: Once | INTRAVENOUS | Status: AC
  Administered 2017-10-02: 5 mg via INTRAVENOUS

## 2017-10-02 MED ORDER — IPRATROPIUM-ALBUTEROL 0.5-2.5 (3) MG/3ML IN SOLN
RESPIRATORY_TRACT | Status: AC
  Administered 2017-10-02: 3 mL
  Filled 2017-10-02: qty 3

## 2017-10-02 MED ORDER — SODIUM CHLORIDE 0.9 % IV SOLN
INTRAVENOUS | Status: DC
  Administered 2017-10-02 (×2): via INTRAVENOUS

## 2017-10-02 MED ORDER — PROPOFOL 500 MG/50ML IV EMUL
INTRAVENOUS | Status: DC | PRN
  Administered 2017-10-02: 150 ug/kg/min via INTRAVENOUS

## 2017-10-02 MED ORDER — IPRATROPIUM-ALBUTEROL 0.5-2.5 (3) MG/3ML IN SOLN
3.0000 mL | Freq: Once | RESPIRATORY_TRACT | Status: AC
  Administered 2017-10-02: 3 mL via RESPIRATORY_TRACT

## 2017-10-02 NOTE — Op Note (Signed)
[facility] Patient Name: Willie Dalton Procedure Date: 10/02/2017 1:48 PM MRN: [id number] Account #: [id number] Date of Birth: [birth year] Admit Type: Outpatient Age: 52 Room: [facility] ENDO ROOM 1 Gender: Male Note Status: Finalized Procedure:            Colonoscopy Indications:          Weight loss Providers:            Jonathon Bellows MD, MD Medicines:            Monitored Anesthesia Care Complications:        No immediate complications. Procedure:            Pre-Anesthesia Assessment:                       - Prior to the procedure, a History and Physical was                        performed, and patient medications, allergies and                        sensitivities were reviewed. The patient's tolerance of                        previous anesthesia was reviewed.                       - The risks and benefits of the procedure and the                        sedation options and risks were discussed with the                        patient. All questions were answered and informed                        consent was obtained.                       - ASA Grade Assessment: III - A patient with severe                        systemic disease.                       After obtaining informed consent, the colonoscope was                        passed under direct vision. Throughout the procedure,                        the patient's blood pressure, pulse, and oxygen                        saturations were monitored continuously. The                        Colonoscope was introduced through the anus and                        advanced to the the cecum, identified by the  appendiceal orifice, IC valve and transillumination.                        The colonoscopy was performed with ease. The patient                        tolerated the procedure well. The quality of the bowel                        preparation was good. Findings:      The  perianal and digital rectal examinations were normal.      Multiple small-mouthed diverticula were found in the entire colon.      Four sessile polyps were found in the sigmoid colon and descending       colon. The polyps were 3 to 4 mm in size. These polyps were removed with       a cold biopsy forceps. Resection and retrieval were complete.      The exam was otherwise without abnormality. Impression:           - Diverticulosis in the entire examined colon.                       - Four 3 to 4 mm polyps in the sigmoid colon and in the                        descending colon, removed with a cold biopsy forceps.                        Resected and retrieved.                       - The examination was otherwise normal. Recommendation:       - Perform an upper GI endoscopy today.                       - Await pathology results. Procedure Code(s):    --- Professional ---                       [phone], Colonoscopy, flexible; with biopsy, single or                        multiple Diagnosis Code(s):    --- Professional ---                       D12.5, Benign neoplasm of sigmoid colon                       D12.4, Benign neoplasm of descending colon                       R63.4, Abnormal weight loss                       K57.30, Diverticulosis of large intestine without                        perforation or abscess without bleeding CPT copyright 2016 American Medical Association. All rights reserved. The codes documented in this report are preliminary and upon coder review may  be revised to meet  current compliance requirements. Jonathon Bellows, MD Jonathon Bellows MD, MD 10/02/2017 2:36:07 PM This report has been signed electronically. Number of Addenda: 0 Note Initiated On: 10/02/2017 1:48 PM Scope Withdrawal Time: 0 hours 19 minutes 56 seconds  Total Procedure Duration: 0 hours 26 minutes 54 seconds       [facility]

## 2017-10-02 NOTE — Transfer of Care (Signed)
Immediate Anesthesia Transfer of Care Note  Patient: Willie Dalton  Procedure(s) Performed: COLONOSCOPY WITH PROPOFOL (N/A ) ESOPHAGOGASTRODUODENOSCOPY (EGD) WITH PROPOFOL (N/A )  Patient Location: PACU  Anesthesia Type:General  Level of Consciousness: sedated  Airway & Oxygen Therapy: Patient Spontanous Breathing and Patient connected to nasal cannula oxygen  Post-op Assessment: Report given to RN and Post -op Vital signs reviewed and stable  Post vital signs: Reviewed and stable  Last Vitals:  Vitals:   10/02/17 1241 10/02/17 1450  BP: (!) 146/105 92/76  Pulse: 66 (!) 113  Resp: 16 12  Temp: (!) 35.8 C (!) 35.9 C  SpO2: 100% 99%    Last Pain:  Vitals:   10/02/17 1450  TempSrc: Tympanic         Complications: No apparent anesthesia complications

## 2017-10-02 NOTE — Anesthesia Post-op Follow-up Note (Signed)
Anesthesia QCDR form completed.        

## 2017-10-02 NOTE — Anesthesia Procedure Notes (Signed)
Date/Time: 10/02/2017 2:08 PM Performed by: Nelda Marseille, CRNA Pre-anesthesia Checklist: Patient identified, Emergency Drugs available, Suction available, Patient being monitored and Timeout performed Oxygen Delivery Method: Nasal cannula

## 2017-10-02 NOTE — Anesthesia Postprocedure Evaluation (Signed)
Anesthesia Post Note  Patient: Willie Dalton  Procedure(s) Performed: COLONOSCOPY WITH PROPOFOL (N/A ) ESOPHAGOGASTRODUODENOSCOPY (EGD) WITH PROPOFOL (N/A )  Patient location during evaluation: PACU Anesthesia Type: General Level of consciousness: awake and alert and oriented Pain management: pain level controlled Vital Signs Assessment: post-procedure vital signs reviewed and stable Respiratory status: spontaneous breathing Cardiovascular status: blood pressure returned to baseline Anesthetic complications: no     Last Vitals:  Vitals:   10/02/17 1500 10/02/17 1510  BP: 119/80 134/85  Pulse: (!) 120 (!) 112  Resp: (!) 46 12  Temp:    SpO2: 100% 100%    Last Pain:  Vitals:   10/02/17 1450  TempSrc: Tympanic                 [name]

## 2017-10-02 NOTE — Op Note (Signed)
[facility] Patient Name: Willie Dalton Procedure Date: 10/02/2017 1:48 PM MRN: [id number] Account #: [id number] Date of Birth: 03-21-1966 Admit Type: Outpatient Age: 52 Room: [facility] ENDO ROOM 1 Gender: Male Note Status: Finalized Procedure:            Upper GI endoscopy Indications:          Abdominal pain Providers:            Jonathon Bellows MD, MD Medicines:            Monitored Anesthesia Care Complications:        No immediate complications. Procedure:            Pre-Anesthesia Assessment:                       - Prior to the procedure, a History and Physical was                        performed, and patient medications, allergies and                        sensitivities were reviewed. The patient's tolerance of                        previous anesthesia was reviewed.                       - The risks and benefits of the procedure and the                        sedation options and risks were discussed with the                        patient. All questions were answered and informed                        consent was obtained.                       - ASA Grade Assessment: III - A patient with severe                        systemic disease.                       After obtaining informed consent, the endoscope was                        passed under direct vision. Throughout the procedure,                        the patient's blood pressure, pulse, and oxygen                        saturations were monitored continuously. The Endoscope                        was introduced through the mouth, and advanced to the                        third part of duodenum. The upper GI endoscopy was  accomplished with ease. The patient tolerated the                        procedure well. Findings:      The esophagus was normal.      The entire examined stomach was normal. Biopsies were taken with a cold       forceps for histology.  One non-bleeding superficial duodenal ulcer with a clean ulcer base       (Forrest Class III) was found in the first portion of the duodenum. The       lesion was 8 mm in largest dimension. Biopsies were taken with a cold       forceps for histology.      The third portion of the duodenum was normal. Biopsies were taken with a       cold forceps for histology.      The exam was otherwise without abnormality. Impression:           - Normal esophagus.                       - Normal stomach. Biopsied.                       - One non-bleeding duodenal ulcer with a clean ulcer                        base (Forrest Class III). Biopsied.                       - Normal third portion of the duodenum. Biopsied.                       - The examination was otherwise normal. Recommendation:       - Discharge patient to home (with escort).                       - Advance diet as tolerated.                       - No ibuprofen, naproxen, or other non-steroidal                        anti-inflammatory drugs.                       - Return to my office in 4 weeks.                       - 1. Prilosec OTC 40 mg once daily Procedure Code(s):    --- Professional ---                       [id number], Esophagogastroduodenoscopy, flexible, transoral;                        with biopsy, single or multiple Diagnosis Code(s):    --- Professional ---                       K26.9, Duodenal ulcer, unspecified as acute or chronic,  without hemorrhage or perforation                       R10.9, Unspecified abdominal pain CPT copyright 2016 American Medical Association. All rights reserved. The codes documented in this report are preliminary and upon coder review may  be revised to meet current compliance requirements. Jonathon Bellows, MD Jonathon Bellows MD, MD 10/02/2017 2:47:29 PM This report has been signed electronically. Number of Addenda: 0 Note Initiated On: 10/02/2017 1:48 PM      [facility]

## 2017-10-02 NOTE — Anesthesia Preprocedure Evaluation (Signed)
Anesthesia Evaluation  Patient identified by MRN, date of birth, ID band Patient awake    Reviewed: Allergy & Precautions, NPO status , Patient's Chart, lab work & pertinent test results  Airway Mallampati: II  TM Distance: >3 FB     Dental  (+) Poor Dentition   Pulmonary asthma , COPD, Current Smoker,     + wheezing      Cardiovascular negative cardio ROS Normal cardiovascular exam     Neuro/Psych negative neurological ROS  negative psych ROS   GI/Hepatic (+)     substance abuse  alcohol use,   Endo/Other  negative endocrine ROS  Renal/GU negative Renal ROS     Musculoskeletal negative musculoskeletal ROS (+)   Abdominal Normal abdominal exam  (+)   Peds  Hematology negative hematology ROS (+)   Anesthesia Other Findings   Reproductive/Obstetrics                             Anesthesia Physical Anesthesia Plan  ASA: III  Anesthesia Plan: General   Post-op Pain Management:    Induction: Intravenous  PONV Risk Score and Plan:   Airway Management Planned: Nasal Cannula  Additional Equipment:   Intra-op Plan:   Post-operative Plan:   Informed Consent: I have reviewed the patients History and Physical, chart, labs and discussed the procedure including the risks, benefits and alternatives for the proposed anesthesia with the patient or authorized representative who has indicated his/her understanding and acceptance.   Dental advisory given  Plan Discussed with: CRNA and Surgeon  Anesthesia Plan Comments:         Anesthesia Quick Evaluation

## 2017-10-02 NOTE — H&P (Signed)
     Jonathon Bellows, MD [address] [place], [place], [place], [address] [address], [place], [place], [place], [address] Phone: [phone]  Fax: [phone]  Primary Care Physician:  Patient, No Pcp Per   Pre-Procedure History & Physical: HPI:  Willie Dalton is a 52 y.o. male is here for an endoscopy and colonoscopy    Past Medical History:  Diagnosis Date  . Asthma     History reviewed. No pertinent surgical history.  Prior to Admission medications   Medication Sig Start Date End Date Taking? Authorizing Provider  omeprazole (PRILOSEC) 40 MG capsule Take 1 capsule (40 mg total) by mouth daily. 09/25/17  Yes Jonathon Bellows, MD  famotidine (PEPCID) 40 MG tablet Take 1 tablet (40 mg total) by mouth daily for 14 days. 09/10/17 09/24/17  Loney Hering, MD  metoCLOPramide (REGLAN) 10 MG tablet  09/19/17   [provider]    Allergies as of 09/25/2017 - Review Complete 09/25/2017  Allergen Reaction Noted  . Bee venom Anaphylaxis 09/17/2017    Family History  Problem Relation Age of Onset  . Colon cancer Mother   . Asthma Mother   . Healthy Father   . Colon cancer Maternal Uncle   . Colon cancer Maternal Uncle     Social History   Socioeconomic History  . Marital status: Single    Spouse name: Not on file  . Number of children: Not on file  . Years of education: Not on file  . Highest education level: Not on file  Social Needs  . Financial resource strain: Not on file  . Food insecurity - worry: Not on file  . Food insecurity - inability: Not on file  . Transportation needs - medical: Not on file  . Transportation needs - non-medical: Not on file  Occupational History  . Not on file  Tobacco Use  . Smoking status: Current Every Day Smoker    Packs/day: 0.50  . Smokeless tobacco: Never Used  Substance and Sexual Activity  . Alcohol use: Yes    Alcohol/week: 8.4 oz    Types: 14 Cans of beer per week  . Drug use: Yes    Types: Marijuana  .  Sexual activity: Yes  Other Topics Concern  . Not on file  Social History Narrative  . Not on file    Review of Systems: See HPI, otherwise negative ROS  Physical Exam: BP (!) 146/105   Pulse 66   Temp (!) 96.5 F (35.8 C) (Tympanic)   Resp 16   Ht 5\' 4"  (1.626 m)   Wt 110 lb (49.9 kg)   SpO2 100%   BMI 18.88 kg/m  General:   Alert,  pleasant and cooperative in NAD Head:  Normocephalic and atraumatic. Neck:  Supple; no masses or thyromegaly. Lungs:  Clear throughout to auscultation, normal respiratory effort.    Heart:  +S1, +S2, Regular rate and rhythm, No edema. Abdomen:  Soft, nontender and nondistended. Normal bowel sounds, without guarding, and without rebound.   Neurologic:  Alert and  oriented x4;  grossly normal neurologically.  Impression/Plan: Willie Dalton is here for an endoscopy and colonoscopy  to be performed for  evaluation of abdominal pain and weight loss    Risks, benefits, limitations, and alternatives regarding endoscopy have been reviewed with the patient.  Questions have been answered.  All parties agreeable.   Jonathon Bellows, MD  10/02/2017, 12:47 PM

## 2017-10-03 ENCOUNTER — Ambulatory Visit: Payer: BLUE CROSS/BLUE SHIELD

## 2017-10-03 ENCOUNTER — Encounter: Payer: Self-pay | Admitting: Gastroenterology

## 2017-10-04 LAB — SURGICAL PATHOLOGY

## 2017-10-14 ENCOUNTER — Telehealth: Payer: Self-pay

## 2017-10-14 DIAGNOSIS — K2970 Gastritis, unspecified, without bleeding: Principal | ICD-10-CM

## 2017-10-14 DIAGNOSIS — B9681 Helicobacter pylori [H. pylori] as the cause of diseases classified elsewhere: Secondary | ICD-10-CM

## 2017-10-14 MED ORDER — CLARITHROMYCIN 500 MG PO TABS: 500.0000 mg | ORAL_TABLET | Freq: Two times a day (BID) | ORAL | 0 refills | Status: AC

## 2017-10-14 MED ORDER — AMOXICILLIN 500 MG PO TABS: 1000.0000 mg | ORAL_TABLET | Freq: Three times a day (TID) | ORAL | 0 refills | Status: AC

## 2017-10-14 NOTE — Telephone Encounter (Signed)
LVM for patient on Ms. Watler to advise Mr. Somoza to call for results per Dr. Vicente Males.    - H pylori gastritis.   Suggest clarithromycin 500 mg PO BID, amoxicillin 1 gram TID, omeprazole 20 mg BID all for 14 days. , check for penicillin allergy and will need repeat H pylori stool antigen to check for eradication after .

## 2017-10-15 ENCOUNTER — Telehealth: Payer: Self-pay

## 2017-10-15 DIAGNOSIS — G8929 Other chronic pain: Secondary | ICD-10-CM

## 2017-10-15 DIAGNOSIS — R1013 Epigastric pain: Principal | ICD-10-CM

## 2017-10-15 LAB — H. PYLORI ANTIGEN, STOOL: H. Pylori Stool Ag, Eia: POSITIVE — AB

## 2017-10-15 MED ORDER — OMEPRAZOLE 20 MG PO CPDR: 20.0000 mg | DELAYED_RELEASE_CAPSULE | Freq: Two times a day (BID) | ORAL | 0 refills | Status: AC

## 2017-10-15 NOTE — Telephone Encounter (Signed)
Advised patient of H. Pylori Gastritis medication at pharmacy.   Patient to callback after 14 days for stool re-check.   Ordered omeprazole.

## 2017-10-21 ENCOUNTER — Ambulatory Visit: Payer: Self-pay | Admitting: Surgery

## 2017-10-21 ENCOUNTER — Encounter: Payer: Self-pay | Admitting: Surgery

## 2017-10-21 ENCOUNTER — Ambulatory Visit (INDEPENDENT_AMBULATORY_CARE_PROVIDER_SITE_OTHER): Payer: BLUE CROSS/BLUE SHIELD | Admitting: Surgery

## 2017-10-21 VITALS — BP 119/82 | HR 84 | Temp 98.0°F | Ht 64.0 in | Wt 120.0 lb

## 2017-10-21 DIAGNOSIS — K828 Other specified diseases of gallbladder: Secondary | ICD-10-CM

## 2017-10-21 NOTE — Patient Instructions (Signed)
We will not need to follow up with you at this time however if you need anything that we can help please give our office a call.

## 2017-10-21 NOTE — Progress Notes (Signed)
Mr. Willie Dalton its 52 year old male well known to me with epigastric pain. Initially thought to be related to his sludge in the gallbladder. Previous exam his history was no clear-cut and I ordered a CT scan of the abdomen and pelvis that I have personally reviewed. There is no evidence of any intra-abdominal pathology or cancer. He also had an upper and lower scopes that revealed a peptic ulcer. Since then he has been treated with PPI with excellent response. He denies any abdominal pain.  PE NAD Abd: soft NT, no peritonitis Ext No edema and well perfused  A/P Abdominal pain likely related to peptic ulcer disease. Sludge currently seems to be asymptomatic. Discussed with the patient in detail that I will not plan on doing any surgery at this point. This sludge may become symptomatic at that time may require cholecystectomy. Follow-up when necessary.  I spent at least 25 minutes in this encounter with greater than 50% spent in counseling and coordination of care

## 2017-11-15 ENCOUNTER — Encounter: Payer: Self-pay | Admitting: Gastroenterology

## 2017-11-15 ENCOUNTER — Telehealth: Payer: Self-pay | Admitting: Gastroenterology

## 2017-11-15 NOTE — Telephone Encounter (Signed)
Phone disconnect/number change.  Sent letter for patient to call and schedule a 4 week follow up with Dr. Vicente Males

## 2019-07-18 IMAGING — US US ABDOMEN LIMITED
1 series · 14 of 25 positions shown · non-contrast
Comparison: None.

CLINICAL DATA: Abdominal pain for 2 months.

EXAM:
ULTRASOUND ABDOMEN LIMITED RIGHT UPPER QUADRANT

[Series 1: us abdomen limited · 0.20mm/px · 14 of 54 slices shown]
[im 1/54]
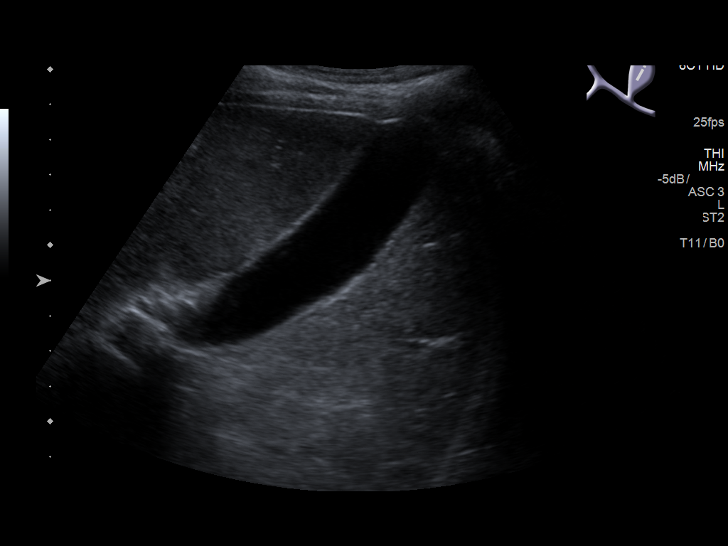
[im 5/54]
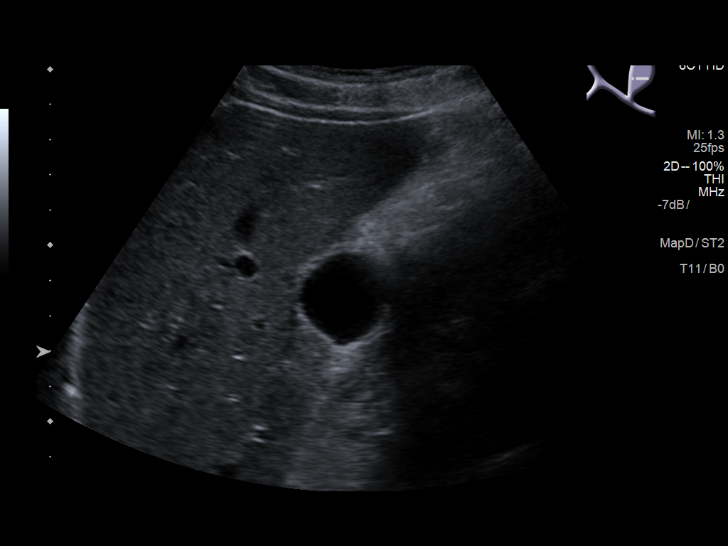
[im 9/54]
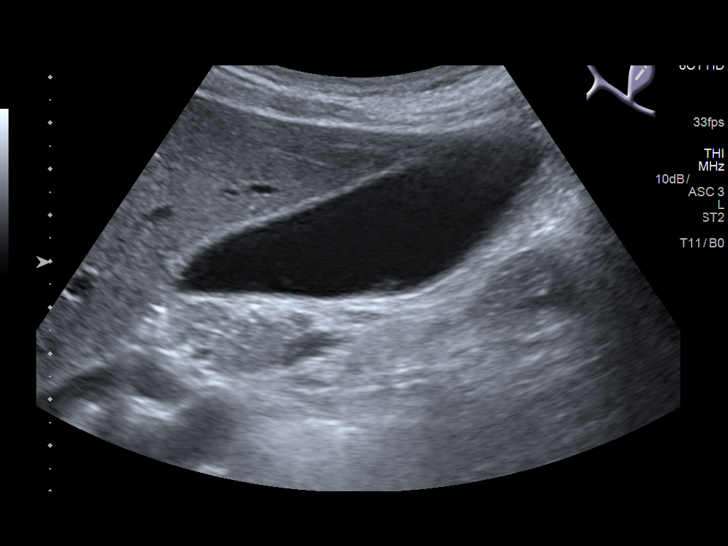
[im 14/54]
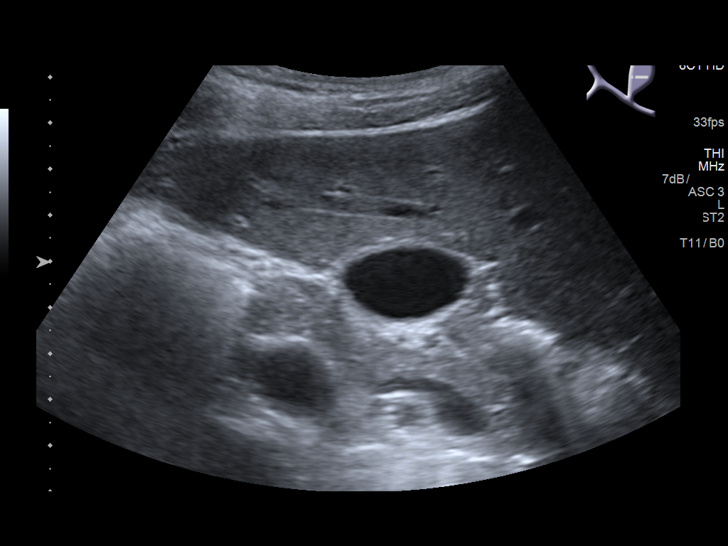
[im 18/54]
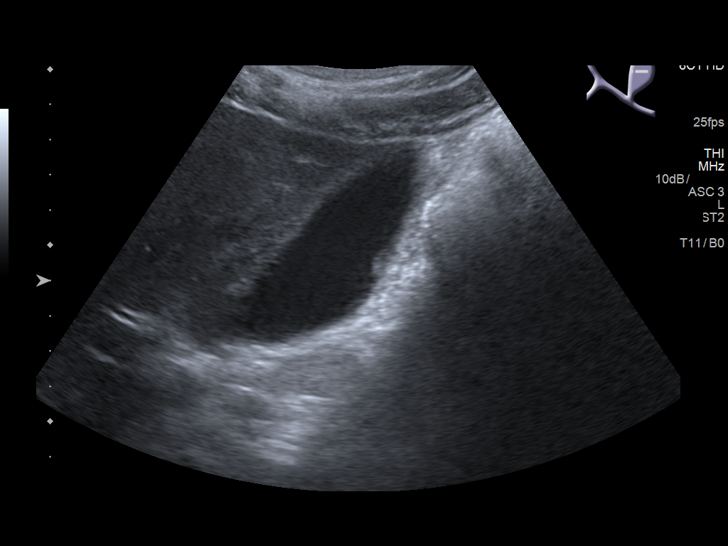
[im 20/54]
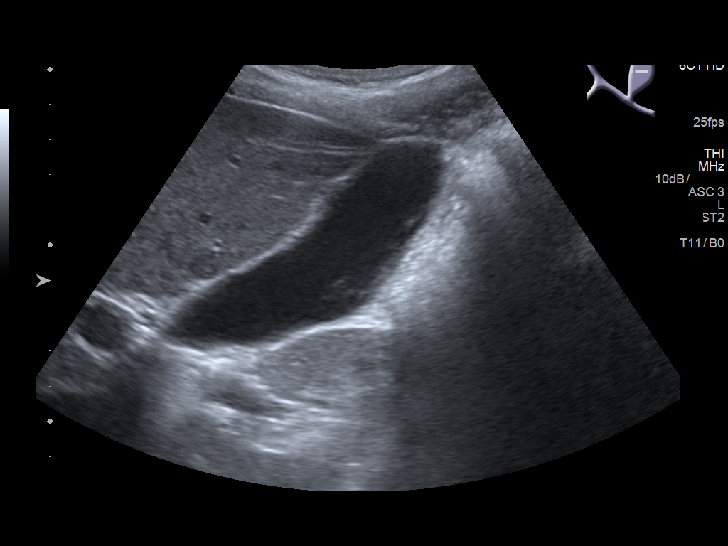
[im 25/54]
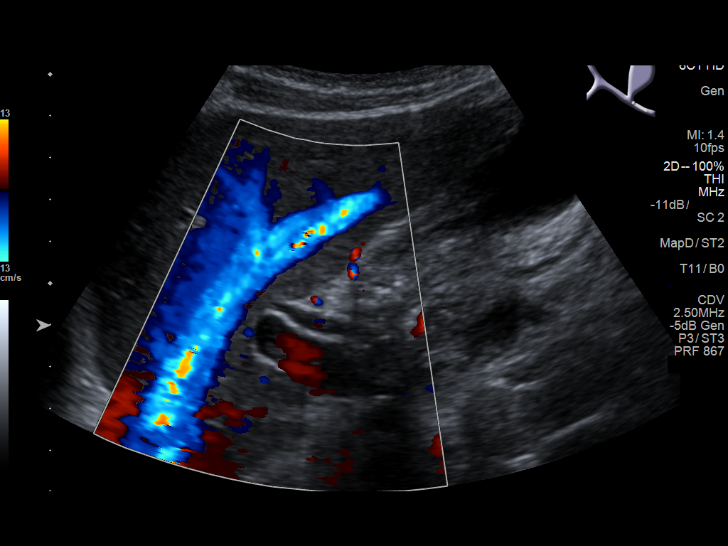
[im 29/54]
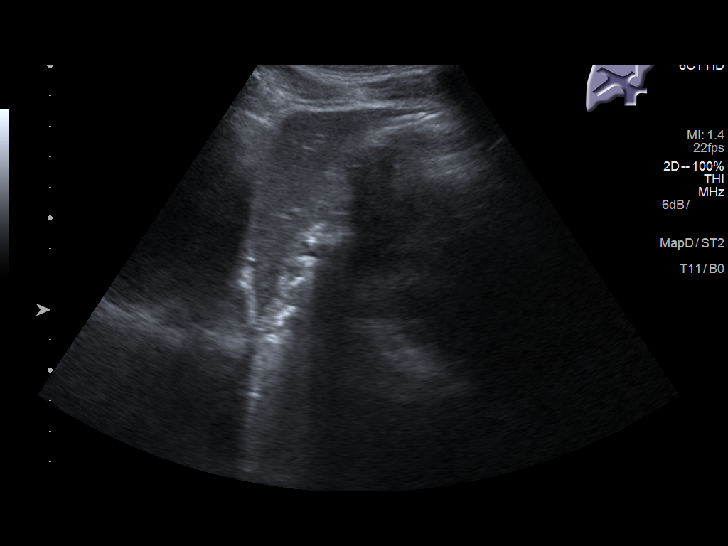
[im 34/54]
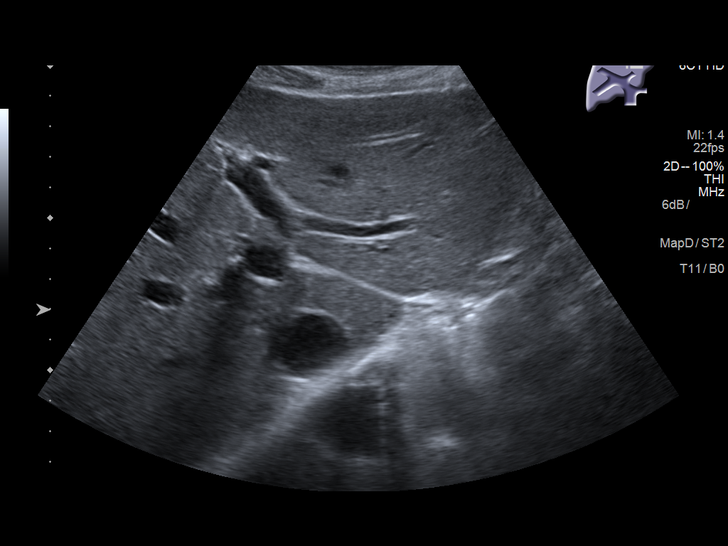
[im 36/54]
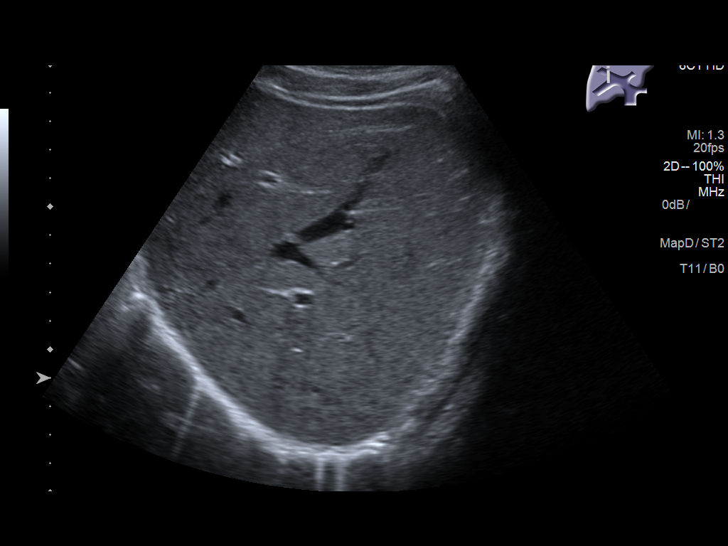
[im 40/54]
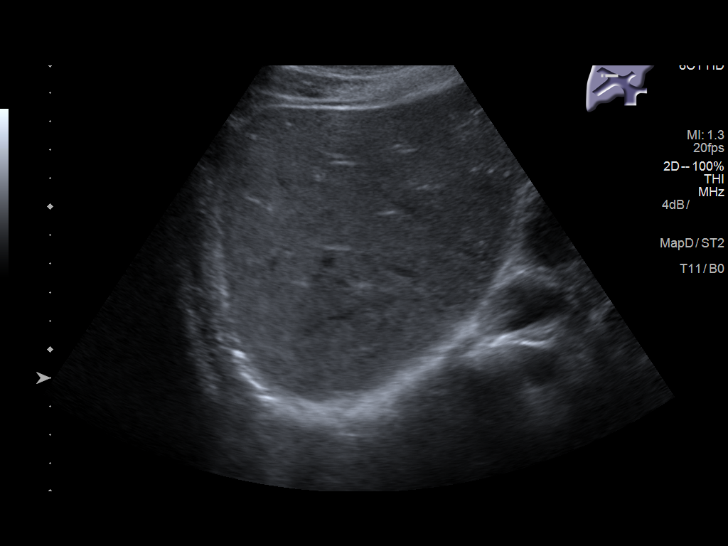
[im 45/54]
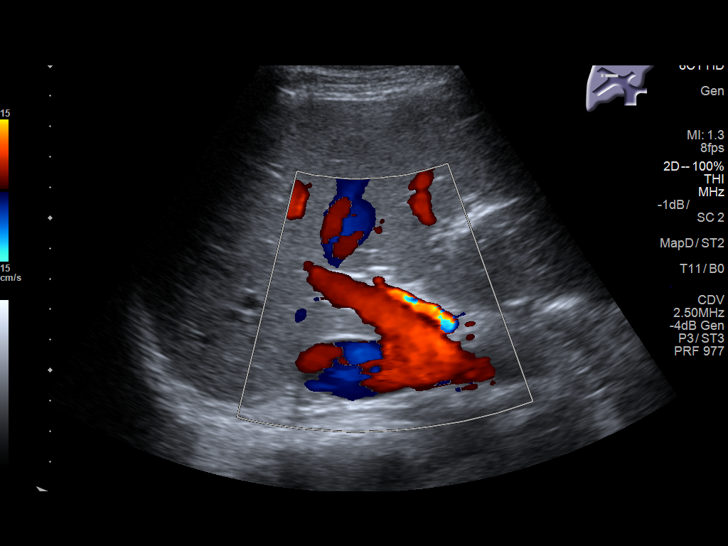
[im 49/54]
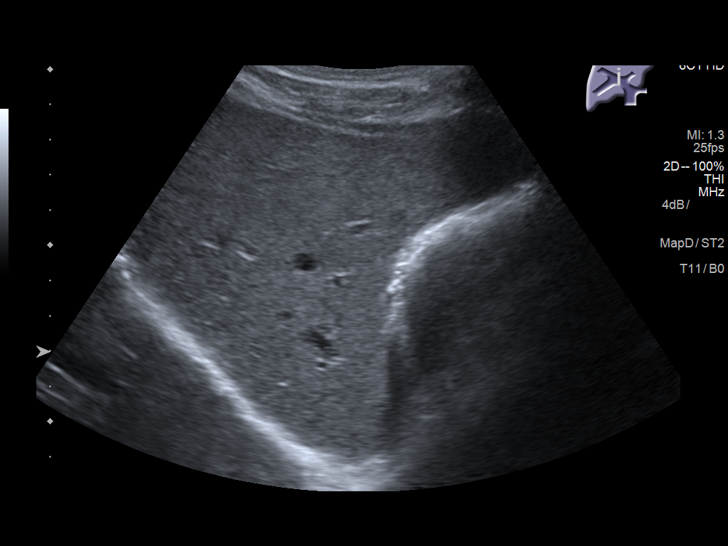
[im 54/54]
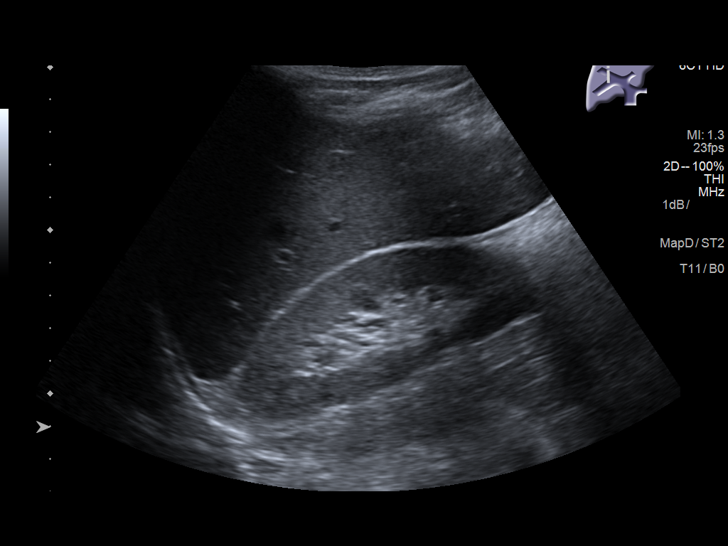

[14 of 25 positions shown; findings below may reference images not displayed]

FINDINGS: Gallbladder:

Physiologically distended with mild intraluminal sludge. No
gallstones or wall thickening visualized. No sonographic Murphy sign
noted by sonographer.

Common bile duct:

Diameter: 4 mm, normal.

Liver:

No focal lesion identified. Within normal limits in parenchymal
echogenicity. Portal vein is patent on color Doppler imaging with
normal direction of blood flow towards the liver.
IMPRESSION: 1. Gallbladder sludge without gallstones or gallbladder
inflammation.
2. No biliary dilatation.
3. Normal sonographic appearance of the liver.

## 2019-12-12 IMAGING — CR DG CHEST 2V
1 series · 2 of 2 positions shown · non-contrast
Comparison: None.

CLINICAL DATA: Shortness of breath.

EXAM:
CHEST  2 VIEW

[Series 1: dg chest 2 view · 0.14mm/px · 2 of 2 slices shown]
[im 1/2]
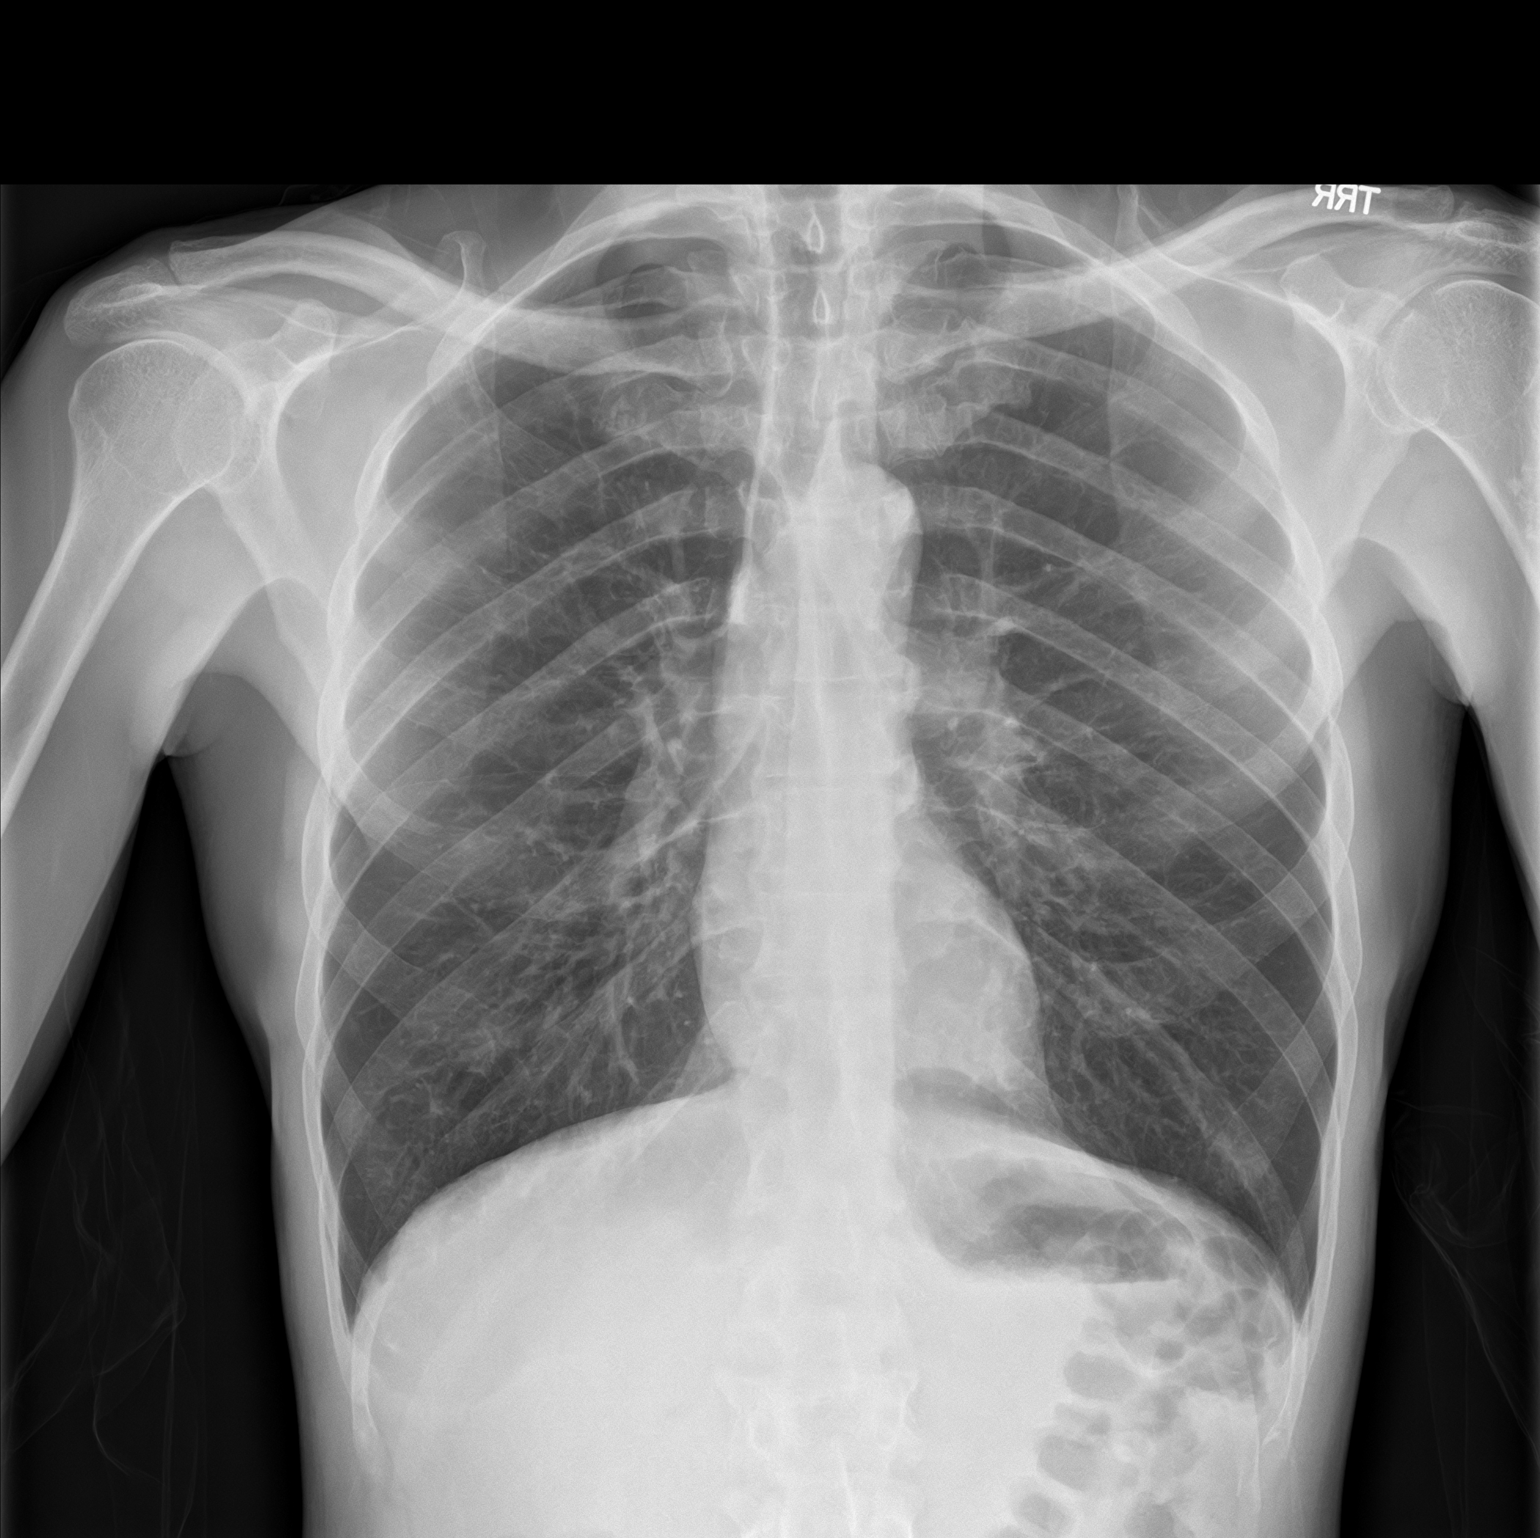
[im 2/2]
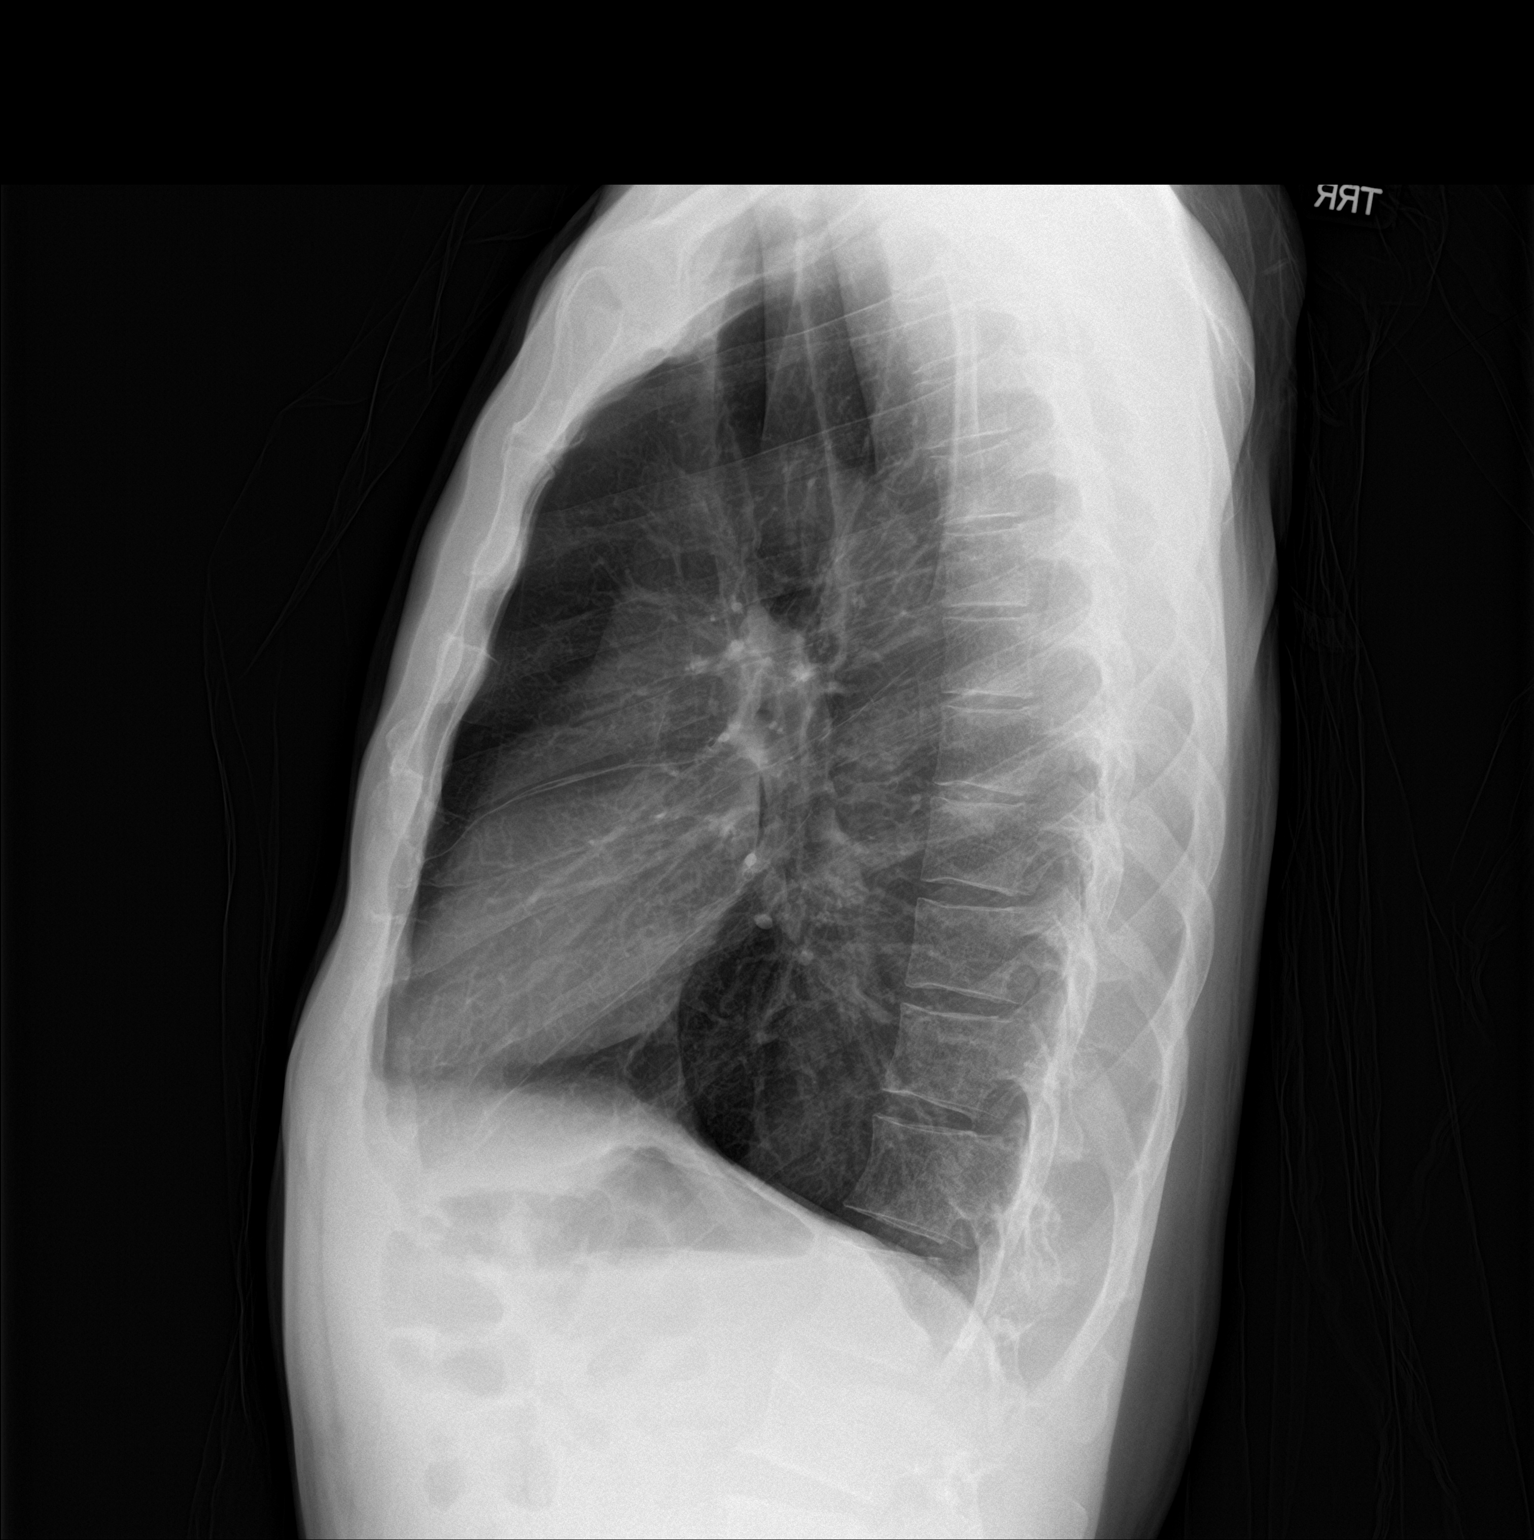

[2 of 2 positions shown; findings below may reference images not displayed]

FINDINGS: The lungs are hyperinflated. The cardiomediastinal contours are
normal. Pulmonary vasculature is normal. No consolidation, pleural
effusion, or pneumothorax. No acute osseous abnormalities are seen.
IMPRESSION: Hyperinflation likely secondary to smoking related lung disease. No
acute abnormality.

## 2019-12-22 IMAGING — CT CT ABD-PELV W/ CM
2 of 5 series · 16 of 46 positions shown, 18 images · IV contrast (APPLIED)
Comparison: Ultrasound 09/10/2017

CLINICAL DATA: Nausea vomiting diarrhea 4 months with 20 pound
weight loss

EXAM:
CT ABDOMEN AND PELVIS WITH CONTRAST
TECHNIQUE: Multidetector CT imaging of the abdomen and pelvis was performed
using the standard protocol following bolus administration of
intravenous contrast.
CONTRAST:  75mL [id number] IOPAMIDOL ([id number]) INJECTION 61%

[Series 2: routine abd/pel with · axial · 0.64mm/px · z∈[-1065,-715]mm · 13 of 80 slices shown, 15 images]
[im 5/80  soft-tissue]
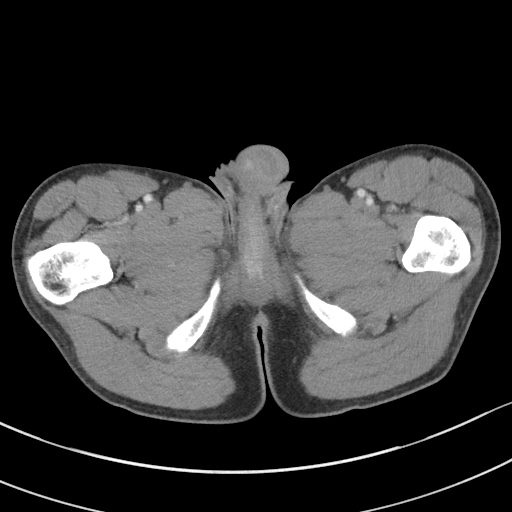
[im 5/80  bone]
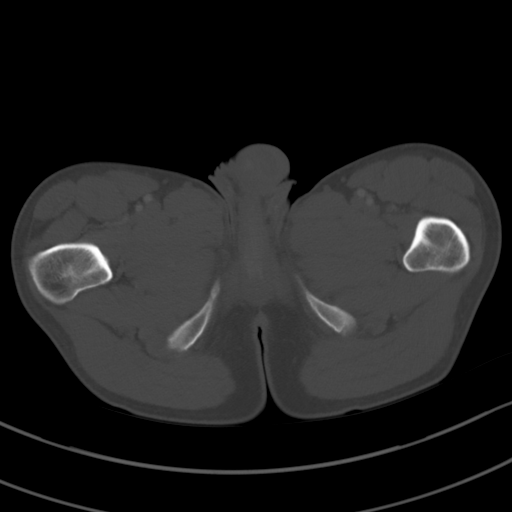
[im 13/80  soft-tissue]
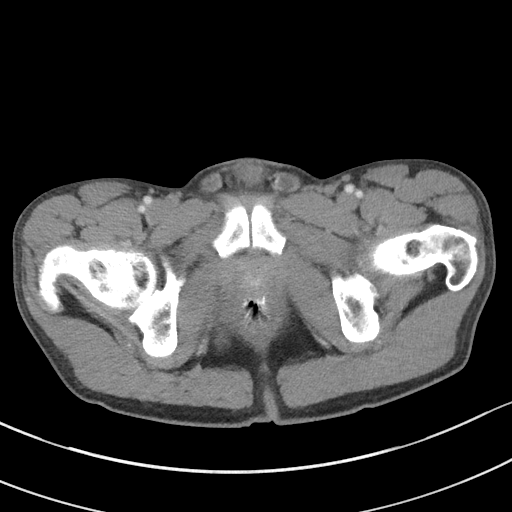
[im 17/80  soft-tissue]
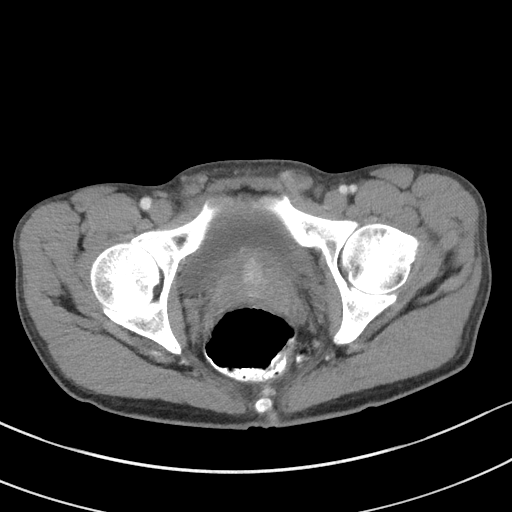
[im 21/80  soft-tissue]
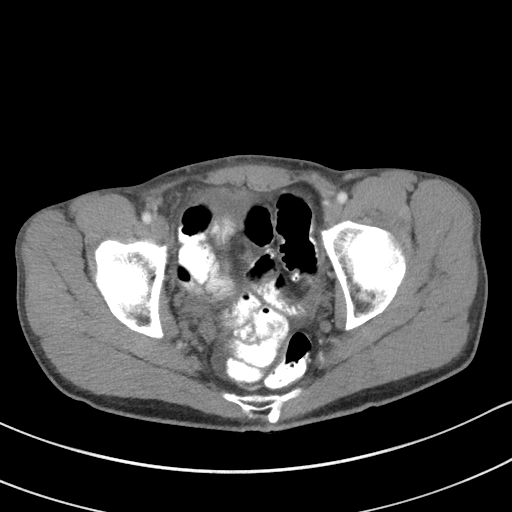
[im 30/80  soft-tissue]
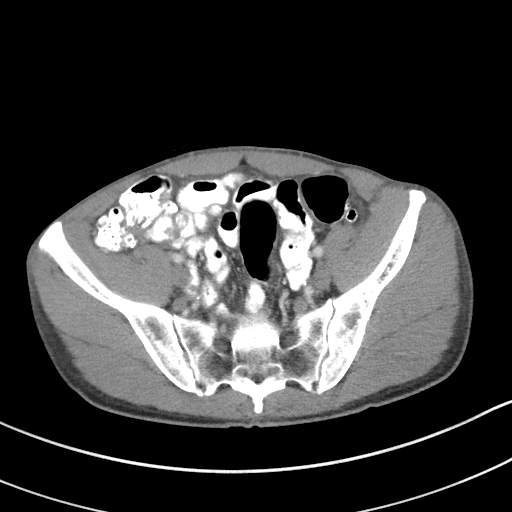
[im 34/80  soft-tissue]
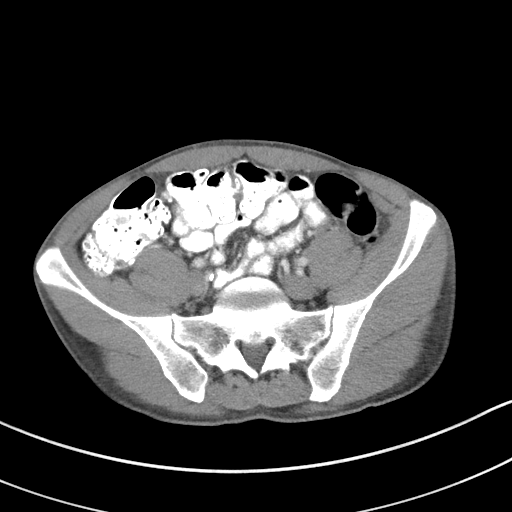
[im 42/80  soft-tissue]
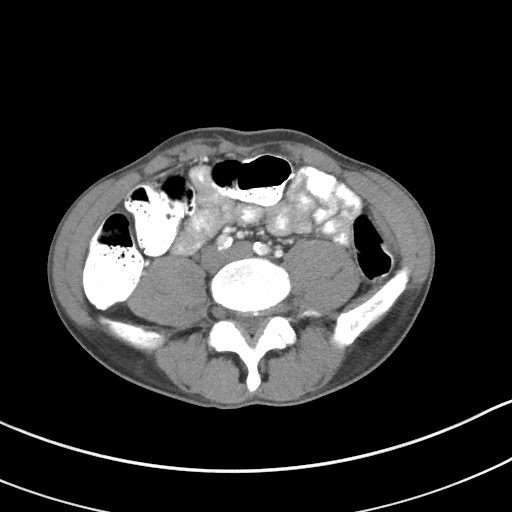
[im 46/80  soft-tissue]
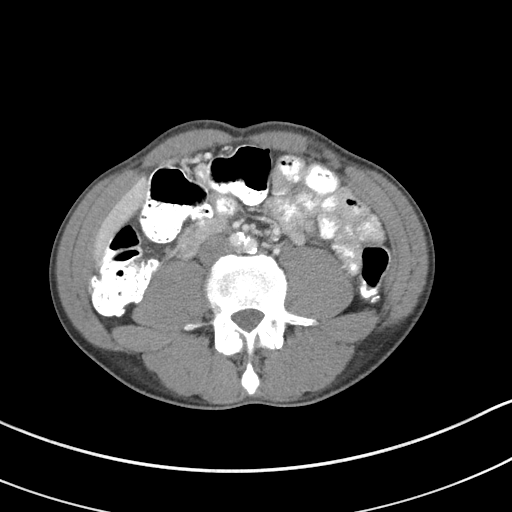
[im 50/80  soft-tissue]
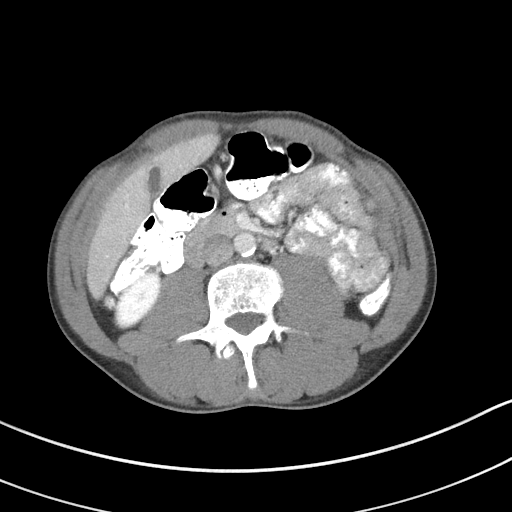
[im 50/80  bone]
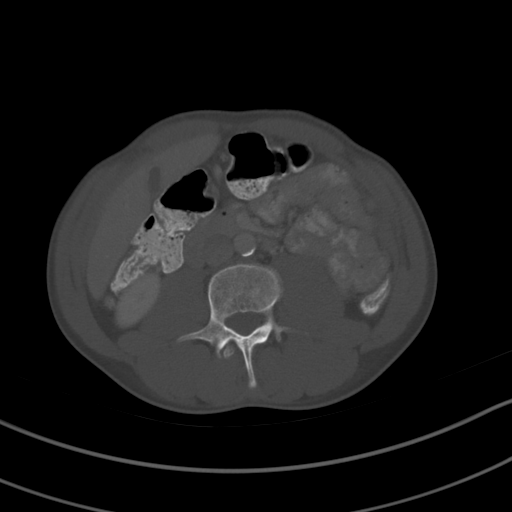
[im 59/80  soft-tissue]
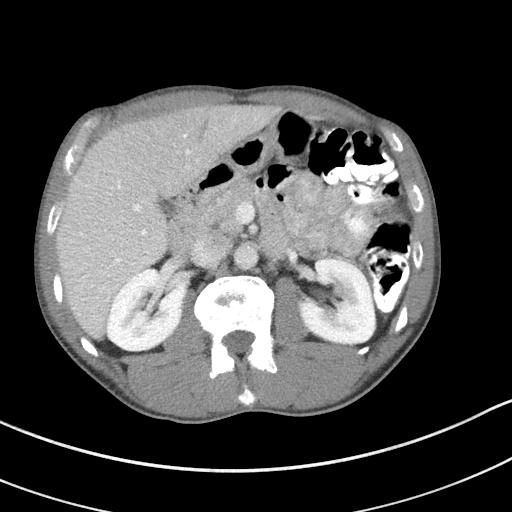
[im 63/80  soft-tissue]
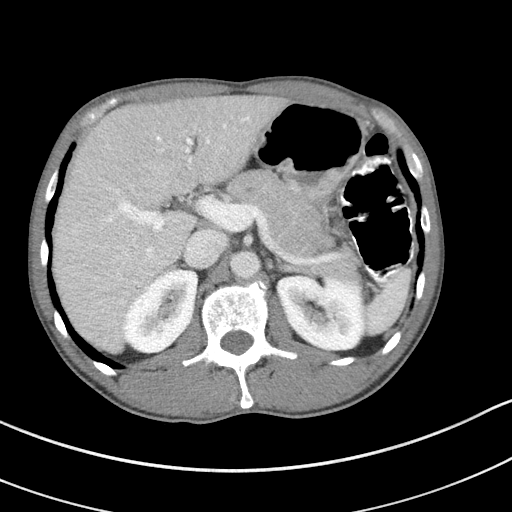
[im 67/80  soft-tissue]
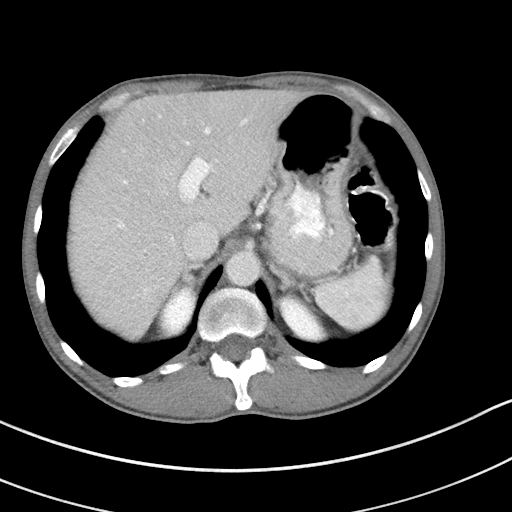
[im 75/80  soft-tissue]
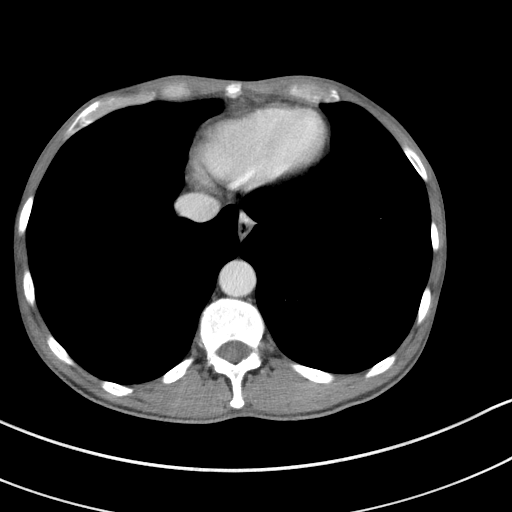

[Series 5: coronal st · coronal · 0.62mm/px · 3 of 79 slices shown]
[im 27/79  soft-tissue]
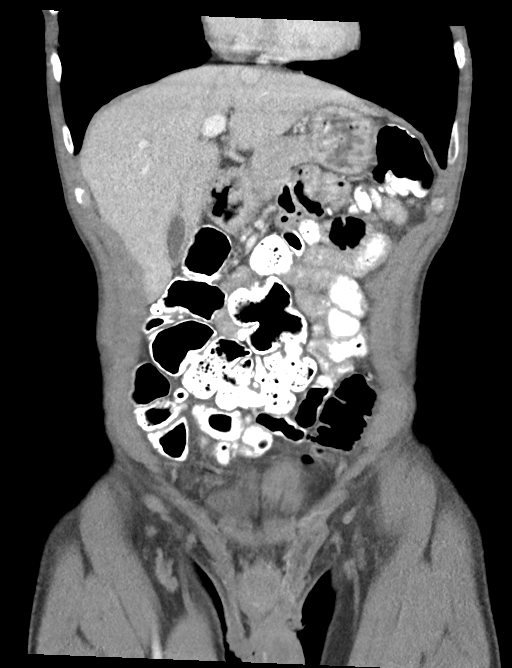
[im 35/79  soft-tissue]
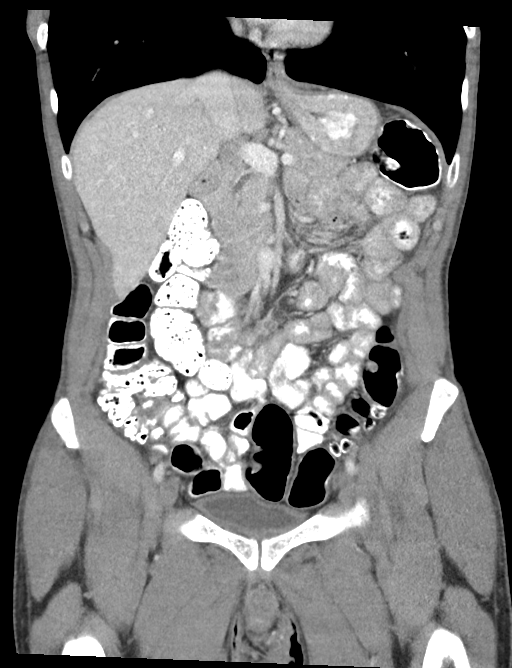
[im 44/79  soft-tissue]
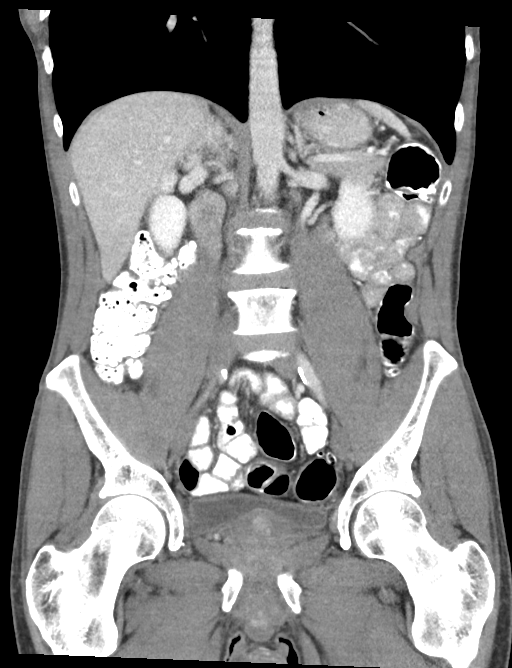

[16 of 46 positions shown; findings below may reference images not displayed]

FINDINGS: Lower chest: negative

Hepatobiliary: Normal liver.  Gallbladder and bile ducts normal.

Pancreas: Negative

Spleen: Negative

Adrenals/Urinary Tract: Normal kidneys. No renal mass or obstruction
or calculus. Normal urinary bladder

Stomach/Bowel: Negative for bowel obstruction. Negative for bowel
mass or edema. Normal appendix. Mild diverticular change in the
sigmoid colon without acute edema.

Vascular/Lymphatic: Atherosclerotic aorta, mild.  No aneurysm.

Reproductive: Mild prostate enlargement

Other: Negative for free fluid

Musculoskeletal: Mild chronic compression fracture superior endplate
of L2. No acute skeletal lesion.
IMPRESSION: No acute abnormality. No cause for weight loss identified. Mild
sigmoid diverticulosis.
